# Patient Record
Sex: Female | Born: 1985
Health system: Southern US, Community
[De-identification: ages and names within clinical notes are randomized; demographics above are authoritative.]

## PROBLEM LIST (undated history)

## (undated) DIAGNOSIS — O24419 Gestational diabetes mellitus in pregnancy, unspecified control: Secondary | ICD-10-CM

## (undated) DIAGNOSIS — T7840XA Allergy, unspecified, initial encounter: Secondary | ICD-10-CM

## (undated) HISTORY — DX: Allergy, unspecified, initial encounter: T78.40XA

## (undated) HISTORY — DX: Gestational diabetes mellitus in pregnancy, unspecified control: O24.419

---

## 2009-12-20 ENCOUNTER — Other Ambulatory Visit: Admission: RE | Admit: 2009-12-20 | Discharge: 2009-12-20 | Payer: Self-pay | Admitting: Obstetrics and Gynecology

## 2010-03-05 ENCOUNTER — Inpatient Hospital Stay (HOSPITAL_COMMUNITY): Admission: AD | Admit: 2010-03-05 | Payer: Self-pay | Admitting: Obstetrics and Gynecology

## 2012-03-06 NOTE — L&D Delivery Note (Signed)
Delivery Note At 6:32 PM a viable female was delivered via Vaginal, Spontaneous Delivery (Presentation: ; Occiput Anterior).  APGAR: 9, 9; weight .   Placenta status: Intact, Spontaneous.  Cord: 3 vessels with the following complications: None.  Cord pH: Na  Anesthesia: Epidural  Episiotomy: None Lacerations: 2nd degree;Perineal;Periurethral Suture Repair: 3.0 vicryl Est. Blood Loss (mL): 300  Mom to postpartum.  Baby to nursery-stable.  Jeanmarc Viernes J. 09/24/2012, 7:48 PM

## 2012-03-15 LAB — OB RESULTS CONSOLE HIV ANTIBODY (ROUTINE TESTING): HIV: NONREACTIVE

## 2012-03-15 LAB — OB RESULTS CONSOLE ABO/RH: RH Type: POSITIVE

## 2012-03-15 LAB — OB RESULTS CONSOLE RPR: RPR: NONREACTIVE

## 2012-03-15 LAB — OB RESULTS CONSOLE ANTIBODY SCREEN: Antibody Screen: NEGATIVE

## 2012-03-19 ENCOUNTER — Other Ambulatory Visit (HOSPITAL_COMMUNITY)
Admission: RE | Admit: 2012-03-19 | Discharge: 2012-03-19 | Disposition: A | Discharge status: Home / Self Care | Payer: BC Managed Care – PPO | Source: Ambulatory Visit | Attending: Obstetrics and Gynecology | Admitting: Obstetrics and Gynecology

## 2012-03-19 DIAGNOSIS — Z113 Encounter for screening for infections with a predominantly sexual mode of transmission: Secondary | ICD-10-CM | POA: Insufficient documentation

## 2012-03-19 DIAGNOSIS — Z01419 Encounter for gynecological examination (general) (routine) without abnormal findings: Secondary | ICD-10-CM | POA: Insufficient documentation

## 2012-08-07 ENCOUNTER — Encounter: Payer: BC Managed Care – PPO | Attending: Obstetrics and Gynecology | Admitting: *Deleted

## 2012-08-07 VITALS — Ht 63.0 in | Wt 186.2 lb

## 2012-08-07 DIAGNOSIS — O99810 Abnormal glucose complicating pregnancy: Secondary | ICD-10-CM

## 2012-08-07 DIAGNOSIS — Z713 Dietary counseling and surveillance: Secondary | ICD-10-CM | POA: Insufficient documentation

## 2012-08-08 ENCOUNTER — Encounter: Payer: Self-pay | Admitting: *Deleted

## 2012-08-08 NOTE — Progress Notes (Signed)
  Patient was seen on 08/07/12 for Gestational Diabetes self-management class at the [facility]. The following learning objectives were met by the patient during this course:   States the definition of Gestational Diabetes  States why dietary management is important in controlling blood glucose  Describes the effects each nutrient has on blood glucose levels  Demonstrates ability to create a balanced meal plan  Demonstrates carbohydrate counting   States when to check blood glucose levels  Demonstrates proper blood glucose monitoring techniques  States the effect of stress and exercise on blood glucose levels  States the importance of limiting caffeine and abstaining from alcohol and smoking  Blood glucose monitor given: NA patient reported she has a meter at home already and is checking her glucose   Patient instructed to monitor glucose levels: FBS: 60 - <90 2 hour: <120  *Patient received handouts:  Nutrition Diabetes and Pregnancy  Carbohydrate Counting List  Patient will be seen for follow-up as needed.

## 2012-08-08 NOTE — Patient Instructions (Signed)
Goals:  Check glucose levels per MD as instructed  Follow Gestational Diabetes Diet as instructed  Call for follow-up as needed    

## 2012-08-29 LAB — OB RESULTS CONSOLE GBS: GBS: NEGATIVE

## 2012-09-23 ENCOUNTER — Other Ambulatory Visit: Payer: Self-pay | Admitting: Obstetrics and Gynecology

## 2012-09-23 ENCOUNTER — Telehealth: Payer: Self-pay | Admitting: Obstetrics and Gynecology

## 2012-09-23 NOTE — Telephone Encounter (Signed)
TC from patient--patient of Dr. Richardson Dopp, 39 weeks, G1P0, spotting and mucusy d/c. Denies pain, reports +FM.  Gestational diabetes, diet controlled. Has appt this upcoming day at [facility]. Advised to CTO at present, call back with any increase in bleeding, decreased FM, increased contractions/pain. Keep scheduled appt at [facility]--can call that office in the am prn, or call back during night with any questions or concerns.

## 2012-09-24 ENCOUNTER — Encounter (HOSPITAL_COMMUNITY): Payer: Self-pay | Admitting: Anesthesiology

## 2012-09-24 ENCOUNTER — Inpatient Hospital Stay (HOSPITAL_COMMUNITY)
Admission: RE | Admit: 2012-09-24 | Discharge: 2012-09-26 | DRG: 372 | Disposition: A | Discharge status: Home / Self Care | Payer: BC Managed Care – PPO | Source: Ambulatory Visit | Attending: Obstetrics and Gynecology | Admitting: Obstetrics and Gynecology

## 2012-09-24 ENCOUNTER — Inpatient Hospital Stay (HOSPITAL_COMMUNITY): Payer: BC Managed Care – PPO | Admitting: Anesthesiology

## 2012-09-24 ENCOUNTER — Encounter (HOSPITAL_COMMUNITY): Payer: Self-pay

## 2012-09-24 DIAGNOSIS — O24419 Gestational diabetes mellitus in pregnancy, unspecified control: Secondary | ICD-10-CM

## 2012-09-24 DIAGNOSIS — O2432 Unspecified pre-existing diabetes mellitus in childbirth: Principal | ICD-10-CM | POA: Diagnosis present

## 2012-09-24 DIAGNOSIS — Z8632 Personal history of gestational diabetes: Secondary | ICD-10-CM | POA: Diagnosis present

## 2012-09-24 DIAGNOSIS — E119 Type 2 diabetes mellitus without complications: Secondary | ICD-10-CM | POA: Diagnosis present

## 2012-09-24 LAB — CBC
MCH: 29.5 pg (ref 26.0–34.0)
MCHC: 35.5 g/dL (ref 30.0–36.0)
Platelets: 170 10*3/uL (ref 150–400)
RBC: 4.48 MIL/uL (ref 3.87–5.11)

## 2012-09-24 LAB — GLUCOSE, CAPILLARY: Glucose-Capillary: 86 mg/dL (ref 70–99)

## 2012-09-24 LAB — RPR: RPR Ser Ql: NONREACTIVE

## 2012-09-24 MED ORDER — OXYCODONE-ACETAMINOPHEN 5-325 MG PO TABS: 1.0000 | ORAL_TABLET | ORAL | Status: DC | PRN

## 2012-09-24 MED ORDER — IBUPROFEN 600 MG PO TABS: 600.0000 mg | ORAL_TABLET | Freq: Four times a day (QID) | ORAL | Status: DC | PRN

## 2012-09-24 MED ORDER — LANOLIN HYDROUS EX OINT: TOPICAL_OINTMENT | CUTANEOUS | Status: DC | PRN

## 2012-09-24 MED ORDER — DIPHENHYDRAMINE HCL 50 MG/ML IJ SOLN: 12.5000 mg | INTRAMUSCULAR | Status: DC | PRN

## 2012-09-24 MED ORDER — SENNOSIDES-DOCUSATE SODIUM 8.6-50 MG PO TABS
2.0000 | ORAL_TABLET | Freq: Every day | ORAL | Status: DC
  Administered 2012-09-25: 2 via ORAL

## 2012-09-24 MED ORDER — LIDOCAINE HCL (PF) 1 % IJ SOLN
30.0000 mL | INTRAMUSCULAR | Status: DC | PRN
  Filled 2012-09-24: qty 30

## 2012-09-24 MED ORDER — FENTANYL 2.5 MCG/ML BUPIVACAINE 1/10 % EPIDURAL INFUSION (WH - ANES)
14.0000 mL/h | INTRAMUSCULAR | Status: DC | PRN
  Filled 2012-09-24: qty 125

## 2012-09-24 MED ORDER — TETANUS-DIPHTH-ACELL PERTUSSIS 5-2.5-18.5 LF-MCG/0.5 IM SUSP
0.5000 mL | Freq: Once | INTRAMUSCULAR | Status: AC
  Administered 2012-09-26: 0.5 mL via INTRAMUSCULAR

## 2012-09-24 MED ORDER — PHENYLEPHRINE 40 MCG/ML (10ML) SYRINGE FOR IV PUSH (FOR BLOOD PRESSURE SUPPORT)
80.0000 ug | PREFILLED_SYRINGE | INTRAVENOUS | Status: DC | PRN
  Filled 2012-09-24: qty 5
  Filled 2012-09-24: qty 2

## 2012-09-24 MED ORDER — PRENATAL MULTIVITAMIN CH
1.0000 | ORAL_TABLET | Freq: Every day | ORAL | Status: DC
  Administered 2012-09-25 – 2012-09-26 (×2): 1 via ORAL
  Filled 2012-09-24 (×2): qty 1

## 2012-09-24 MED ORDER — PHENYLEPHRINE 40 MCG/ML (10ML) SYRINGE FOR IV PUSH (FOR BLOOD PRESSURE SUPPORT)
80.0000 ug | PREFILLED_SYRINGE | INTRAVENOUS | Status: DC | PRN
  Filled 2012-09-24: qty 2

## 2012-09-24 MED ORDER — EPHEDRINE 5 MG/ML INJ
10.0000 mg | INTRAVENOUS | Status: DC | PRN
  Filled 2012-09-24: qty 2
  Filled 2012-09-24: qty 4

## 2012-09-24 MED ORDER — IBUPROFEN 600 MG PO TABS
600.0000 mg | ORAL_TABLET | Freq: Four times a day (QID) | ORAL | Status: DC
  Administered 2012-09-24 – 2012-09-26 (×7): 600 mg via ORAL
  Filled 2012-09-24 (×7): qty 1

## 2012-09-24 MED ORDER — ONDANSETRON HCL 4 MG/2ML IJ SOLN: 4.0000 mg | Freq: Four times a day (QID) | INTRAMUSCULAR | Status: DC | PRN

## 2012-09-24 MED ORDER — CITRIC ACID-SODIUM CITRATE 334-500 MG/5ML PO SOLN: 30.0000 mL | ORAL | Status: DC | PRN

## 2012-09-24 MED ORDER — SIMETHICONE 80 MG PO CHEW: 80.0000 mg | CHEWABLE_TABLET | ORAL | Status: DC | PRN

## 2012-09-24 MED ORDER — LACTATED RINGERS IV SOLN: 500.0000 mL | Freq: Once | INTRAVENOUS | Status: DC

## 2012-09-24 MED ORDER — OXYTOCIN 40 UNITS IN LACTATED RINGERS INFUSION - SIMPLE MED
1.0000 m[IU]/min | INTRAVENOUS | Status: DC
Start: 2012-09-24 — End: 2012-09-24
  Administered 2012-09-24: 2 m[IU]/min via INTRAVENOUS
  Filled 2012-09-24: qty 1000

## 2012-09-24 MED ORDER — WITCH HAZEL-GLYCERIN EX PADS: 1.0000 "application " | MEDICATED_PAD | CUTANEOUS | Status: DC | PRN

## 2012-09-24 MED ORDER — ONDANSETRON HCL 4 MG PO TABS: 4.0000 mg | ORAL_TABLET | ORAL | Status: DC | PRN

## 2012-09-24 MED ORDER — METHYLERGONOVINE MALEATE 0.2 MG PO TABS: 0.2000 mg | ORAL_TABLET | ORAL | Status: DC | PRN

## 2012-09-24 MED ORDER — LIDOCAINE HCL (PF) 1 % IJ SOLN
INTRAMUSCULAR | Status: DC | PRN
  Administered 2012-09-24 (×2): 4 mL

## 2012-09-24 MED ORDER — DIPHENHYDRAMINE HCL 25 MG PO CAPS: 25.0000 mg | ORAL_CAPSULE | Freq: Four times a day (QID) | ORAL | Status: DC | PRN

## 2012-09-24 MED ORDER — OXYTOCIN BOLUS FROM INFUSION: 500.0000 mL | INTRAVENOUS | Status: DC

## 2012-09-24 MED ORDER — FENTANYL 2.5 MCG/ML BUPIVACAINE 1/10 % EPIDURAL INFUSION (WH - ANES)
INTRAMUSCULAR | Status: DC | PRN
  Administered 2012-09-24: 14 mL/h via EPIDURAL

## 2012-09-24 MED ORDER — METHYLERGONOVINE MALEATE 0.2 MG/ML IJ SOLN: 0.2000 mg | INTRAMUSCULAR | Status: DC | PRN

## 2012-09-24 MED ORDER — ACETAMINOPHEN 325 MG PO TABS: 650.0000 mg | ORAL_TABLET | ORAL | Status: DC | PRN

## 2012-09-24 MED ORDER — ZOLPIDEM TARTRATE 5 MG PO TABS: 5.0000 mg | ORAL_TABLET | Freq: Every evening | ORAL | Status: DC | PRN

## 2012-09-24 MED ORDER — LACTATED RINGERS IV SOLN
INTRAVENOUS | Status: DC
  Administered 2012-09-24 (×2): 1000 mL via INTRAVENOUS
  Administered 2012-09-24: 18:00:00 via INTRAVENOUS

## 2012-09-24 MED ORDER — BENZOCAINE-MENTHOL 20-0.5 % EX AERO
1.0000 "application " | INHALATION_SPRAY | CUTANEOUS | Status: DC | PRN
  Administered 2012-09-25: 1 via TOPICAL
  Filled 2012-09-24: qty 56

## 2012-09-24 MED ORDER — DIBUCAINE 1 % RE OINT: 1.0000 "application " | TOPICAL_OINTMENT | RECTAL | Status: DC | PRN

## 2012-09-24 MED ORDER — TERBUTALINE SULFATE 1 MG/ML IJ SOLN: 0.2500 mg | Freq: Once | INTRAMUSCULAR | Status: DC | PRN

## 2012-09-24 MED ORDER — LACTATED RINGERS IV SOLN: 500.0000 mL | INTRAVENOUS | Status: DC | PRN

## 2012-09-24 MED ORDER — BUTORPHANOL TARTRATE 1 MG/ML IJ SOLN: 1.0000 mg | INTRAMUSCULAR | Status: DC | PRN

## 2012-09-24 MED ORDER — OXYTOCIN 40 UNITS IN LACTATED RINGERS INFUSION - SIMPLE MED: 62.5000 mL/h | INTRAVENOUS | Status: DC

## 2012-09-24 MED ORDER — EPHEDRINE 5 MG/ML INJ
10.0000 mg | INTRAVENOUS | Status: DC | PRN
  Filled 2012-09-24: qty 2

## 2012-09-24 MED ORDER — ONDANSETRON HCL 4 MG/2ML IJ SOLN: 4.0000 mg | INTRAMUSCULAR | Status: DC | PRN

## 2012-09-24 NOTE — Anesthesia Preprocedure Evaluation (Signed)
Anesthesia Evaluation  Patient identified by MRN, date of birth, ID band Patient awake    Reviewed: Allergy & Precautions, H&P , NPO status , Patient's Chart, lab work & pertinent test results  Airway Mallampati: III TM Distance: >3 FB Neck ROM: Full    Dental no notable dental hx. (+) Teeth Intact   Pulmonary neg pulmonary ROS,  breath sounds clear to auscultation  Pulmonary exam normal       Cardiovascular negative cardio ROS  Rhythm:Regular Rate:Normal     Neuro/Psych negative neurological ROS  negative psych ROS   GI/Hepatic negative GI ROS, Neg liver ROS,   Endo/Other  diabetes, Well Controlled, Gestational  Renal/GU negative Renal ROS  negative genitourinary   Musculoskeletal negative musculoskeletal ROS (+)   Abdominal   Peds  Hematology negative hematology ROS (+)   Anesthesia Other Findings   Reproductive/Obstetrics (+) Pregnancy                           Anesthesia Physical Anesthesia Plan  ASA: II  Anesthesia Plan: Epidural   Post-op Pain Management:    Induction:   Airway Management Planned: Natural Airway  Additional Equipment:   Intra-op Plan:   Post-operative Plan:   Informed Consent: I have reviewed the patients History and Physical, chart, labs and discussed the procedure including the risks, benefits and alternatives for the proposed anesthesia with the patient or authorized representative who has indicated his/her understanding and acceptance.   Dental advisory given  Plan Discussed with: Anesthesiologist  Anesthesia Plan Comments:         Anesthesia Quick Evaluation

## 2012-09-24 NOTE — H&P (Signed)
Denise Drake is a 27 y.o. female presenting for  39wks and 2 days with A1 diabetes for induction.     Maternal Medical History:  Fetal activity: Perceived fetal activity is normal.   Last perceived fetal movement was within the past hour.      OB History   Grav Para Term Preterm Abortions TAB SAB Ect Mult Living   1              Past Medical History  Diagnosis Date  . Diabetes mellitus without complication    History reviewed. No pertinent past surgical history. Family History: family history includes Diabetes in her paternal grandfather. Social History:  reports that she has never smoked. She does not have any smokeless tobacco history on file. She reports that she does not drink alcohol or use illicit drugs.   Prenatal Transfer Tool  Maternal Diabetes: Yes:  Diabetes Type:  Diet controlled Genetic Screening: Normal Maternal Ultrasounds/Referrals: Normal Fetal Ultrasounds or other Referrals:  None Maternal Substance Abuse:  No Significant Maternal Medications:  None Significant Maternal Lab Results:  Lab values include: Group B Strep negative Other Comments:  None  Review of Systems  All other systems reviewed and are negative.    Dilation: 3.5 Effacement (%): 70 Station: 0 Exam by:: Savion Washam Blood pressure 95/59, pulse 95, temperature 98.2 F (36.8 C), temperature source Oral, resp. rate 18, height 5\' 3"  (1.6 m), weight 87.091 kg (192 lb). Maternal Exam:  Abdomen: Patient reports no abdominal tenderness. Fetal presentation: vertex  Introitus: Normal vulva. Normal vagina.    Fetal Exam Fetal Monitor Review: Mode: fetoscope.   Baseline rate: 130.  Variability: moderate (6-25 bpm).   Pattern: accelerations present and no decelerations.    Fetal State Assessment: Category I - tracings are normal.     Physical Exam  Constitutional: She is oriented to person, place, and time. She appears well-developed and well-nourished.  HENT:  Head: Normocephalic.   Cardiovascular: Normal rate and regular rhythm.   Respiratory: Effort normal.  Genitourinary: Vagina normal.  Musculoskeletal: Normal range of motion.  Neurological: She is alert and oriented to person, place, and time.  Skin: Skin is warm and dry.  Psychiatric: She has a normal mood and affect.    Prenatal labs: ABO, Rh: A/Positive/-- (01/10 0000) Antibody: Negative (01/10 0000) Rubella:   Immune  RPR: Nonreactive (05/09 0000)  HBsAg: Negative (01/10 0000)  HIV: Non-reactive, Non-reactive (01/10 0000)  GBS: Negative (06/26 0000)   Assessment/Plan: 39 wks and 2 days with A1 diabetes for induction Arom clear fluid.  Pitocin for induction.  Epidural for pain Anticipate svd    Merilee Wible J. 09/24/2012, 1:40 PM

## 2012-09-24 NOTE — Anesthesia Procedure Notes (Signed)
Epidural Patient location during procedure: OB Start time: 09/24/2012 1:52 PM  Staffing Anesthesiologist: Raybon Conard A. Performed by: anesthesiologist   Preanesthetic Checklist Completed: patient identified, site marked, surgical consent, pre-op evaluation, timeout performed, IV checked, risks and benefits discussed and monitors and equipment checked  Epidural Patient position: sitting Prep: site prepped and draped and DuraPrep Patient monitoring: continuous pulse ox and blood pressure Approach: midline Injection technique: LOR air  Needle:  Needle type: Tuohy  Needle gauge: 17 G Needle length: 9 cm and 9 Needle insertion depth: 5 cm cm Catheter type: closed end flexible Catheter size: 19 Gauge Catheter at skin depth: 10 cm Test dose: negative and Other  Assessment Events: blood not aspirated, injection not painful, no injection resistance, negative IV test and no paresthesia  Additional Notes Patient identified. Risks and benefits discussed including failed block, incomplete  Pain control, post dural puncture headache, nerve damage, paralysis, blood pressure Changes, nausea, vomiting, reactions to medications-both toxic and allergic and post Partum back pain. All questions were answered. Patient expressed understanding and wished to proceed. Sterile technique was used throughout procedure. Epidural site was Dressed with sterile barrier dressing. No paresthesias, signs of intravascular injection Or signs of intrathecal spread were encountered.  Patient was more comfortable after the epidural was dosed. Please see RN's note for documentation of vital signs and FHR which are stable.

## 2012-09-25 LAB — CBC
HCT: 34.9 % — ABNORMAL LOW (ref 36.0–46.0)
MCH: 29.7 pg (ref 26.0–34.0)
MCV: 83.7 fL (ref 78.0–100.0)
RDW: 13.6 % (ref 11.5–15.5)
WBC: 13 10*3/uL — ABNORMAL HIGH (ref 4.0–10.5)

## 2012-09-25 NOTE — Anesthesia Postprocedure Evaluation (Signed)
Anesthesia Post Note  Patient: Denise Drake  Procedure(s) Performed: * No procedures listed *  Anesthesia type: Epidural  Patient location: Mother/Baby  Post pain: Pain level controlled  Post assessment: Post-op Vital signs reviewed  Last Vitals:  Filed Vitals:   09/25/12 0630  BP: 131/85  Pulse: 114  Temp: 36.7 C  Resp: 18    Post vital signs: Reviewed  Level of consciousness:alert  Complications: No apparent anesthesia complications 

## 2012-09-25 NOTE — Progress Notes (Signed)
Post Partum Day 1 svd  Subjective: no complaints, up ad lib and tolerating PO  foley removed less than an hour ago no void yet.. Pt has not been seen by lactation consultant.  Objective: Blood pressure 103/66, pulse 96, temperature 98.3 F (36.8 C), temperature source Oral, resp. rate 18, height 5\' 3"  (1.6 m), weight 87.091 kg (192 lb), SpO2 98.00%, unknown if currently breastfeeding.  Physical Exam:  General: alert and cooperative Lochia: appropriate Uterine Fundus: firm Incision: NA DVT Evaluation: No evidence of DVT seen on physical exam.   Recent Labs  09/24/12 0700 09/25/12 0625  HGB 13.2 12.4  HCT 37.2 34.9*    Assessment/Plan: Plan for discharge tomorrow and Lactation consult  recommend ice pack to perineum to help with swelling now that foley is removed.    LOS: 1 day   Denise Drake J. 09/25/2012, 1:15 PM

## 2012-09-25 NOTE — Anesthesia Postprocedure Evaluation (Signed)
Anesthesia Post Note  Patient: Denise Drake  Procedure(s) Performed: * No procedures listed *  Anesthesia type: Epidural  Patient location: Mother/Baby  Post pain: Pain level controlled  Post assessment: Post-op Vital signs reviewed  Last Vitals:  Filed Vitals:   09/25/12 0630  BP: 131/85  Pulse: 114  Temp: 36.7 C  Resp: 18    Post vital signs: Reviewed  Level of consciousness:alert  Complications: No apparent anesthesia complications

## 2012-09-26 DIAGNOSIS — Z8632 Personal history of gestational diabetes: Secondary | ICD-10-CM | POA: Diagnosis present

## 2012-09-26 MED ORDER — IBUPROFEN 600 MG PO TABS: 600.0000 mg | ORAL_TABLET | Freq: Four times a day (QID) | ORAL | Status: DC | PRN

## 2012-09-26 NOTE — Discharge Summary (Signed)
Obstetric Discharge Summary Reason for Admission: induction of labor Prenatal Procedures: none Intrapartum Procedures: spontaneous vaginal delivery Postpartum Procedures: none Complications-Operative and Postpartum: 2nd  degree perineal laceration Hemoglobin  Date Value Range Status  09/25/2012 12.4  12.0 - 15.0 g/dL Final     HCT  Date Value Range Status  09/25/2012 34.9* 36.0 - 46.0 % Final    Physical Exam:  General: alert and cooperative Lochia: appropriate Uterine Fundus: firm Incision: NA DVT Evaluation: No evidence of DVT seen on physical exam.  Discharge Diagnoses: Term Pregnancy-delivered  Discharge Information: Date: 09/26/2012 Activity: pelvic rest Diet: routine Medications: PNV and Ibuprofen Condition: stable Instructions: refer to practice specific booklet Discharge to: home Follow-up Information   Follow up with Jessee Avers., MD. Schedule an appointment as soon as possible for a visit in 6 weeks.   Contact information:   [address] [place] [address] [phone]       Newborn Data: Live born female  Birth Weight: 6 lb 15.6 oz (3164 g) APGAR: 9, 9  Home with mother.  Lorrie Strauch J. 09/26/2012, 9:55 AM

## 2012-09-29 ENCOUNTER — Inpatient Hospital Stay (HOSPITAL_COMMUNITY): Admission: AD | Admit: 2012-09-29 | Payer: Self-pay | Source: Ambulatory Visit | Admitting: Obstetrics and Gynecology

## 2012-10-01 ENCOUNTER — Ambulatory Visit (HOSPITAL_COMMUNITY)
Admission: RE | Admit: 2012-10-01 | Discharge: 2012-10-01 | Disposition: A | Discharge status: Home / Self Care | Payer: BC Managed Care – PPO | Source: Ambulatory Visit | Attending: Obstetrics and Gynecology | Admitting: Obstetrics and Gynecology

## 2012-10-01 NOTE — Lactation Note (Signed)
Adult Lactation Consultation Outpatient Visit Note  Patient Name: Ilene Pinera Date of Birth: 1985-08-07 Gestational Age at Delivery: [redacted]w[redacted]d Type of Delivery: Vaginal Delivery 7/22 TERM   Birth weight - 7-3 oz  last weight - 6-5 oz with Smart start  Today's weight- 6-9.6 oz   Reason for [facility] appointment today - using a nipple shield and DL latch in the hospital Breastfeeding History: In the hospital had a challenging time with latch due to swollen areolas, D/C with a Lactation plan of care, and F/U for feeding assessment was today . ( Mom has followed the [facility] plan of care.)  Frequency of Breastfeeding: every 2 1/2 -3 hours for 20 -30 mins per mom  Length of Feeding:  Voids: 7-8 wets  Stools: 2 greenish yellow   Supplementing / Method: Supplementing after feedings 1-2 oz - EBM or formula  Pumping:  Type of Pump: DEBP Medela    Frequency:after every feeding for 10 -15 mins   Volume:  1 -1 1/2 oz   Comments:    Consultation Evaluation:  LC assessed both breast , full , but not engorged, nipples bilaterally appear pink , no breakdown ( per mom denies soreness). Resized #20 Nipple shield and #24 Nipple shield . LC had mom apply nipple shield ( which she did well on both breast ), Right nipple - # 24 proper fit , left #20 NS or #24 Nipple shield plained tomom once the swelling decreases ( areola , the #24 NS will be the one to use ) .                                               This same LC saw mom on the day of D/C from the hospital and the areola edema has changed slightly - Nipple shields and pumping are still indicated. ( mom aware )   Initial Feeding Assessment:             Do baby getting hungry and mom feeding 30 mins at the breast and supplementing 2 oz of formula an hour before the consult, LC unable to assess baby feeding ( LC only did a weight check )                                                            F/U apt. Made for next Tuesday 8/5 at 230 pm for feeding assessment  - ( Stressed to mom and dad if the baby gets hungry and hour before - only give her a snack and bring her in somewhat hungry so a full feeding assessment can be done with the nipple shields. In the mean time continue pumping after feedings for 10 1-15 mins to establish and protect milk supply, save milk and supplement back to baby if needed. Stressed rest and naps for mom and dad ( they both expressed feelings of being tired . )  Pre-feed Weight: 6-9.6 oz 2994 g  Post-feed Weight: Amount Transferred: Comments: see above  Total Breast milk Transferred this Visit:  Total Supplement Given:   Lactation Plan of Care -  See above under Lactation evaluation.    Follow-Up- F/U 8/5 Tuesday 230 pm  at [facility] for feeding assessment .                    - Per parents - F/U at [facility], [facility], [facility] in one month.       Kathrin Greathouse 10/01/2012, 4:54 PM

## 2012-10-08 ENCOUNTER — Ambulatory Visit (HOSPITAL_COMMUNITY): Payer: BC Managed Care – PPO

## 2012-10-15 ENCOUNTER — Ambulatory Visit (HOSPITAL_COMMUNITY): Payer: BC Managed Care – PPO | Attending: Obstetrics and Gynecology

## 2014-01-05 ENCOUNTER — Encounter (HOSPITAL_COMMUNITY): Payer: Self-pay

## 2017-01-15 ENCOUNTER — Ambulatory Visit (INDEPENDENT_AMBULATORY_CARE_PROVIDER_SITE_OTHER): Payer: BLUE CROSS/BLUE SHIELD | Admitting: Family Medicine

## 2017-01-15 ENCOUNTER — Encounter: Payer: Self-pay | Admitting: Family Medicine

## 2017-01-15 VITALS — BP 100/70 | HR 98 | Ht 63.0 in | Wt 153.0 lb

## 2017-01-15 DIAGNOSIS — Z0001 Encounter for general adult medical examination with abnormal findings: Secondary | ICD-10-CM | POA: Diagnosis not present

## 2017-01-15 DIAGNOSIS — Z8632 Personal history of gestational diabetes: Secondary | ICD-10-CM | POA: Diagnosis not present

## 2017-01-15 NOTE — Progress Notes (Signed)
Subjective:  Mikel CellaSharmista Guevarra is a 31 y.o. female who presents today for her annual comprehensive physical exam.    HPI:  Dayne Coward has no acute complaints today.   Lifestyle Diet: Balanced diet. A lot of carbs. Plenty of fruits and vegetables.  Exercise: Do pushups in the morning. Planning to start.   Depression screen PHQ 2/9 01/15/2017  Decreased Interest 0  Down, Depressed, Hopeless 0  PHQ - 2 Score 0   Pertinent Gynecological History: No LMP recorded. Sexually active: No Menses: flow is moderate Last pap: normal Date: 2013  OB History    Gravida Para Term Preterm AB Living   1 1 1     1    SAB TAB Ectopic Multiple Live Births           1     Health Maintenance Due  Topic Date Due  . PAP SMEAR  11/21/2005    ROS: Per HPI, otherwise a 14 point review of systems was performed and was negative  PMH:  The following were reviewed and entered/updated in epic: Past Medical History:  Diagnosis Date  . Diabetes mellitus without complication [facility](HCC)    Patient Active Problem List   Diagnosis Date Noted  . Gestational diabetes 09/26/2012   History reviewed. No pertinent surgical history.  Family History  Problem Relation Age of Onset  . Diabetes Paternal Grandfather    Medications- reviewed and updated No current outpatient medications on file.   No current facility-administered medications for this visit.     Allergies-reviewed and updated Allergies  Allergen Reactions  . Eucalyptus Oil Rash    Social History   Socioeconomic History  . Marital status: Single    Spouse name: None  . Number of children: 1  . Years of education: None  . Highest education level: None  Social Needs  . Financial resource strain: None  . Food insecurity - worry: None  . Food insecurity - inability: None  . Transportation needs - medical: None  . Transportation needs - non-medical: None  Occupational History  . Occupation: Consulting civil engineertudent   Tobacco Use  .  Smoking status: Never Smoker  . Smokeless tobacco: Never Used  Substance and Sexual Activity  . Alcohol use: No  . Drug use: No  . Sexual activity: Yes  Other Topics Concern  . None  Social History Narrative  . None   Objective:  Physical Exam: BP 100/70   Pulse 98   Ht 5\' 3"  (1.6 m)   Wt 153 lb (69.4 kg)   SpO2 99%   BMI 27.10 kg/m   Body mass index is 27.1 kg/m. Gen: NAD, resting comfortably HEENT: TMs normal bilaterally. OP clear. No thyromegaly noted.  CV: RRR with no murmurs appreciated Pulm: NWOB, CTAB with no crackles, wheezes, or rhonchi GI: Normal bowel sounds present. Soft, Nontender, Nondistended. MSK: no edema, cyanosis, or clubbing noted Skin: warm, dry Neuro: CN2-12 grossly intact. Strength 5/5 in upper and lower extremities. Reflexes symmetric and intact bilaterally.  Psych: Normal affect and thought content  Assessment/Plan:  Preventative Healthcare: Check basic labs, lipid panel, and A1c.  Flu shot deferred.  Patient will be going to her OB GYN for Pap smear next month.  Patient Counseling:  -Nutrition: Stressed importance of moderation in sodium/caffeine intake, saturated fat and cholesterol, caloric balance, sufficient intake of fresh fruits, vegetables, and fiber.  -Stressed the importance of regular exercise.   -Substance Abuse: Discussed cessation/primary prevention of tobacco, alcohol, or other drug use; driving or  other dangerous activities under the influence; availability of treatment for abuse.   -Injury prevention: Discussed safety belts, safety helmets, smoke detector, smoking near bedding or upholstery.   -Dental health: Discussed importance of regular tooth brushing, flossing, and dental visits.  -Health maintenance and immunizations reviewed. Please refer to Health maintenance section.  Return to care in 1 year for next preventative visit.   Katina Degreealeb M. Jimmey RalphParker, MD 01/15/2017 4:17 PM

## 2017-01-15 NOTE — Patient Instructions (Signed)
Preventive Care 18-39 Years, Female Preventive care refers to lifestyle choices and visits with your health care provider that can promote health and wellness. What does preventive care include?  A yearly physical exam. This is also called an annual well check.  Dental exams once or twice a year.  Routine eye exams. Ask your health care provider how often you should have your eyes checked.  Personal lifestyle choices, including: ? Daily care of your teeth and gums. ? Regular physical activity. ? Eating a healthy diet. ? Avoiding tobacco and drug use. ? Limiting alcohol use. ? Practicing safe sex. ? Taking vitamin and mineral supplements as recommended by your health care provider. What happens during an annual well check? The services and screenings done by your health care provider during your annual well check will depend on your age, overall health, lifestyle risk factors, and family history of disease. Counseling Your health care provider may ask you questions about your:  Alcohol use.  Tobacco use.  Drug use.  Emotional well-being.  Home and relationship well-being.  Sexual activity.  Eating habits.  Work and work Statistician.  Method of birth control.  Menstrual cycle.  Pregnancy history.  Screening You may have the following tests or measurements:  Height, weight, and BMI.  Diabetes screening. This is done by checking your blood sugar (glucose) after you have not eaten for a while (fasting).  Blood pressure.  Lipid and cholesterol levels. These may be checked every 5 years starting at age 66.  Skin check.  Hepatitis C blood test.  Hepatitis B blood test.  Sexually transmitted disease (STD) testing.  BRCA-related cancer screening. This may be done if you have a family history of breast, ovarian, tubal, or peritoneal cancers.  Pelvic exam and Pap test. This may be done every 3 years starting at age 40. Starting at age 59, this may be done every 5  years if you have a Pap test in combination with an HPV test.  Discuss your test results, treatment options, and if necessary, the need for more tests with your health care provider. Vaccines Your health care provider may recommend certain vaccines, such as:  Influenza vaccine. This is recommended every year.  Tetanus, diphtheria, and acellular pertussis (Tdap, Td) vaccine. You may need a Td booster every 10 years.  Varicella vaccine. You may need this if you have not been vaccinated.  HPV vaccine. If you are 69 or younger, you may need three doses over 6 months.  Measles, mumps, and rubella (MMR) vaccine. You may need at least one dose of MMR. You may also need a second dose.  Pneumococcal 13-valent conjugate (PCV13) vaccine. You may need this if you have certain conditions and were not previously vaccinated.  Pneumococcal polysaccharide (PPSV23) vaccine. You may need one or two doses if you smoke cigarettes or if you have certain conditions.  Meningococcal vaccine. One dose is recommended if you are age 27-21 years and a first-year college student living in a residence hall, or if you have one of several medical conditions. You may also need additional booster doses.  Hepatitis A vaccine. You may need this if you have certain conditions or if you travel or work in places where you may be exposed to hepatitis A.  Hepatitis B vaccine. You may need this if you have certain conditions or if you travel or work in places where you may be exposed to hepatitis B.  Haemophilus influenzae type b (Hib) vaccine. You may need this if  you have certain risk factors.  Talk to your health care provider about which screenings and vaccines you need and how often you need them. This information is not intended to replace advice given to you by your health care provider. Make sure you discuss any questions you have with your health care provider. Document Released: 04/18/2001 Document Revised: 11/10/2015  Document Reviewed: 12/22/2014 Elsevier Interactive Patient Education  2017 [employer].

## 2017-01-16 LAB — HEMOGLOBIN A1C: HEMOGLOBIN A1C: 4.8 % (ref 4.6–6.5)

## 2017-01-16 LAB — LIPID PANEL
CHOL/HDL RATIO: 3
Cholesterol: 135 mg/dL (ref 0–200)
HDL: 53.5 mg/dL (ref >39.00)
LDL Cholesterol: 62 mg/dL (ref 0–99)
NONHDL: 81.37
Triglycerides: 99 mg/dL (ref 0.0–149.0)
VLDL: 19.8 mg/dL (ref 0.0–40.0)

## 2017-01-16 LAB — COMPREHENSIVE METABOLIC PANEL
ALBUMIN: 4.1 g/dL (ref 3.5–5.2)
ALK PHOS: 56 U/L (ref 39–117)
ALT: 12 U/L (ref 0–35)
AST: 17 U/L (ref 0–37)
BILIRUBIN TOTAL: 0.3 mg/dL (ref 0.2–1.2)
BUN: 9 mg/dL (ref 6–23)
CALCIUM: 9.1 mg/dL (ref 8.4–10.5)
CHLORIDE: 103 meq/L (ref 96–112)
CO2: 28 mEq/L (ref 19–32)
CREATININE: 0.56 mg/dL (ref 0.40–1.20)
GFR: 133.21 mL/min (ref >60.00)
Glucose, Bld: 89 mg/dL (ref 70–99)
Potassium: 4.3 mEq/L (ref 3.5–5.1)
Sodium: 137 mEq/L (ref 135–145)
TOTAL PROTEIN: 7.2 g/dL (ref 6.0–8.3)

## 2017-01-16 LAB — CBC
HEMATOCRIT: 38 % (ref 36.0–46.0)
HEMOGLOBIN: 12.4 g/dL (ref 12.0–15.0)
MCHC: 32.8 g/dL (ref 30.0–36.0)
MCV: 83.6 fl (ref 78.0–100.0)
Platelets: 189 10*3/uL (ref 150.0–400.0)
RBC: 4.54 Mil/uL (ref 3.87–5.11)
RDW: 13.5 % (ref 11.5–15.5)
WBC: 7.2 10*3/uL (ref 4.0–10.5)

## 2017-01-18 NOTE — Progress Notes (Signed)
Labs all normal. Please inform patient.   Katina Degreealeb M. Jimmey RalphParker, MD 01/18/2017 11:07 AM

## 2017-02-13 ENCOUNTER — Other Ambulatory Visit: Payer: Self-pay | Admitting: Nurse Practitioner

## 2017-02-13 ENCOUNTER — Other Ambulatory Visit (HOSPITAL_COMMUNITY)
Admission: RE | Admit: 2017-02-13 | Discharge: 2017-02-13 | Disposition: A | Discharge status: Home / Self Care | Payer: BLUE CROSS/BLUE SHIELD | Source: Ambulatory Visit | Attending: Nurse Practitioner | Admitting: Nurse Practitioner

## 2017-02-13 DIAGNOSIS — Z124 Encounter for screening for malignant neoplasm of cervix: Secondary | ICD-10-CM | POA: Diagnosis not present

## 2017-02-15 LAB — CYTOLOGY - PAP
DIAGNOSIS: NEGATIVE
HPV (WINDOPATH): NOT DETECTED

## 2017-07-02 ENCOUNTER — Ambulatory Visit (INDEPENDENT_AMBULATORY_CARE_PROVIDER_SITE_OTHER): Payer: BLUE CROSS/BLUE SHIELD | Admitting: Family Medicine

## 2017-07-02 ENCOUNTER — Encounter: Payer: Self-pay | Admitting: Family Medicine

## 2017-07-02 VITALS — BP 98/58 | HR 113 | Temp 98.6°F | Ht 62.0 in | Wt 156.0 lb

## 2017-07-02 DIAGNOSIS — M79604 Pain in right leg: Secondary | ICD-10-CM

## 2017-07-02 DIAGNOSIS — M79605 Pain in left leg: Secondary | ICD-10-CM

## 2017-07-02 DIAGNOSIS — J020 Streptococcal pharyngitis: Secondary | ICD-10-CM | POA: Diagnosis not present

## 2017-07-02 DIAGNOSIS — J029 Acute pharyngitis, unspecified: Secondary | ICD-10-CM

## 2017-07-02 LAB — POCT RAPID STREP A (OFFICE): Rapid Strep A Screen: POSITIVE — AB

## 2017-07-02 MED ORDER — AMOXICILLIN 875 MG PO TABS: 875.0000 mg | ORAL_TABLET | Freq: Two times a day (BID) | ORAL | 0 refills | Status: DC

## 2017-07-02 NOTE — Progress Notes (Signed)
    Subjective:  Denise Drake is a 32 y.o. female who presents today for same-day appointment with a chief complaint of sore throat.   HPI:  Sore Throat, Acute Issue   Started yesterday.  Worsened today.  Recently on vacation to the [employer] and was around other sick individuals.  Occasional fever.  No reported rash.  Pain is worse with swallowing.  No other obvious alleviating or aggravating factors.  Leg pain, acute issue Started within the last week.  Located in her calves bilaterally.  Thinks it is due to exertion related to walking on the beach.  Worse with going up steps.   ROS: Per HPI  PMH: She reports that she has never smoked. She has never used smokeless tobacco. She reports that she does not drink alcohol or use drugs.  Objective:  Physical Exam: BP (!) 98/58   Pulse (!) 113   Temp 98.6 F (37 C) (Oral)   Ht  (1.575 m)   Wt 156 lb (70.8 kg)   SpO2 99%   Breastfeeding? No   BMI 28.53 kg/m   Gen: NAD, resting comfortably HEENT: TMs clear.  Bibasilar tonsillar hypertrophy. CV: RRR with no murmurs appreciated Pulm: NWOB, CTAB with no crackles, wheezes, or rhonchi MSK: Mild tenderness to palpation of calves bilaterally.  Homans sign negative.  No edema.  Results for orders placed or performed in visit on 07/02/17 (from the past 24 hour(s))  POCT rapid strep A     Status: Abnormal   Collection Time: 07/02/17  4:53 PM  Result Value Ref Range   Rapid Strep A Screen Positive (A) Negative   Assessment/Plan:  Strep pharyngitis Rapid strep positive.  Start amoxicillin 875 mg twice daily for 1 week.  Offered systemic steroids, however patient deferred.  Encouraged good oral hydration.  Also recommend Tylenol and/or Motrin as needed.  Bilateral leg pain Likely related to mild muscular strain from over exertion while walking on the beach.  No red flag signs or symptoms.  Well score 0-doubt DVT.  Encouraged heating pads, and Tylenol and/or Motrin as needed.   Return precautions reviewed.  Katina Degree. Jimmey Ralph, MD 07/02/2017 4:55 PM

## 2017-07-02 NOTE — Patient Instructions (Signed)
You have strep throat.  Please start amoxicillin today.  Please take 1 pill twice daily for the next week.  Please stay well-hydrated.  You can also try taking Tylenol and/or ibuprofen as needed for pain and fever.  If your symptoms worsen, or do not improve over the next several days, please let me know.  Take care, Dr. Jimmey Ralph

## 2017-12-19 ENCOUNTER — Other Ambulatory Visit: Payer: Self-pay | Admitting: Obstetrics and Gynecology

## 2017-12-19 DIAGNOSIS — N6310 Unspecified lump in the right breast, unspecified quadrant: Secondary | ICD-10-CM

## 2017-12-24 ENCOUNTER — Other Ambulatory Visit: Payer: Self-pay | Admitting: Obstetrics and Gynecology

## 2017-12-24 ENCOUNTER — Ambulatory Visit
Admission: RE | Admit: 2017-12-24 | Discharge: 2017-12-24 | Disposition: A | Discharge status: Home / Self Care | Payer: BLUE CROSS/BLUE SHIELD | Source: Ambulatory Visit | Attending: Obstetrics and Gynecology | Admitting: Obstetrics and Gynecology

## 2017-12-24 DIAGNOSIS — N6310 Unspecified lump in the right breast, unspecified quadrant: Secondary | ICD-10-CM

## 2017-12-26 ENCOUNTER — Ambulatory Visit
Admission: RE | Admit: 2017-12-26 | Discharge: 2017-12-26 | Disposition: A | Discharge status: Home / Self Care | Payer: BLUE CROSS/BLUE SHIELD | Source: Ambulatory Visit | Attending: Obstetrics and Gynecology | Admitting: Obstetrics and Gynecology

## 2017-12-26 DIAGNOSIS — N6310 Unspecified lump in the right breast, unspecified quadrant: Secondary | ICD-10-CM

## 2018-01-15 ENCOUNTER — Encounter: Payer: BLUE CROSS/BLUE SHIELD | Admitting: Family Medicine

## 2018-01-15 DIAGNOSIS — Z0289 Encounter for other administrative examinations: Secondary | ICD-10-CM

## 2018-01-24 ENCOUNTER — Encounter: Payer: Self-pay | Admitting: Family Medicine

## 2018-02-12 ENCOUNTER — Encounter: Payer: Self-pay | Admitting: Family Medicine

## 2018-02-12 ENCOUNTER — Ambulatory Visit (INDEPENDENT_AMBULATORY_CARE_PROVIDER_SITE_OTHER): Payer: BLUE CROSS/BLUE SHIELD | Admitting: Family Medicine

## 2018-02-12 VITALS — BP 108/68 | HR 73 | Temp 98.6°F | Ht 62.0 in | Wt 156.2 lb

## 2018-02-12 DIAGNOSIS — Z8632 Personal history of gestational diabetes: Secondary | ICD-10-CM | POA: Diagnosis not present

## 2018-02-12 DIAGNOSIS — Z0001 Encounter for general adult medical examination with abnormal findings: Secondary | ICD-10-CM

## 2018-02-12 DIAGNOSIS — Z87898 Personal history of other specified conditions: Secondary | ICD-10-CM | POA: Insufficient documentation

## 2018-02-12 DIAGNOSIS — Z6828 Body mass index (BMI) 28.0-28.9, adult: Secondary | ICD-10-CM | POA: Diagnosis not present

## 2018-02-12 NOTE — Progress Notes (Signed)
Subjective:  Mikel CellaSharmista Phang is a 32 y.o. female who presents today for her annual comprehensive physical exam.    HPI:  She has no acute complaints today.   She was recently diagnosed with a breast lump and underwent biopsy which was benign.   Lifestyle Diet: No specific diets. Tries to eat a healthy, balanced diet.  Exercise: No regular exercise.   Depression screen PHQ 2/9 02/12/2018  Decreased Interest 0  Down, Depressed, Hopeless 0  PHQ - 2 Score 0   There are no preventive care reminders to display for this patient.   ROS: Per HPI, otherwise a complete review of systems was negative.   PMH:  The following were reviewed and entered/updated in epic: Past Medical History:  Diagnosis Date  . Diabetes mellitus without complication [facility](HCC)    Patient Active Problem List   Diagnosis Date Noted  . History of breast lump 02/12/2018  . History of gestational diabetes 09/26/2012   History reviewed. No pertinent surgical history.  Family History  Problem Relation Age of Onset  . Diabetes Paternal Grandfather     Medications- reviewed and updated No current outpatient medications on file.   No current facility-administered medications for this visit.     Allergies-reviewed and updated Allergies  Allergen Reactions  . Eucalyptus Oil Rash    Social History   Socioeconomic History  . Marital status: Single    Spouse name: Not on file  . Number of children: 1  . Years of education: Not on file  . Highest education level: Not on file  Occupational History  . Occupation: Consulting civil engineertudent   Social Needs  . Financial resource strain: Not on file  . Food insecurity:    Worry: Not on file    Inability: Not on file  . Transportation needs:    Medical: Not on file    Non-medical: Not on file  Tobacco Use  . Smoking status: Never Smoker  . Smokeless tobacco: Never Used  Substance and Sexual Activity  . Alcohol use: No  . Drug use: No  . Sexual activity: Yes    Lifestyle  . Physical activity:    Days per week: Not on file    Minutes per session: Not on file  . Stress: Not on file  Relationships  . Social connections:    Talks on phone: Not on file    Gets together: Not on file    Attends religious service: Not on file    Active member of club or organization: Not on file    Attends meetings of clubs or organizations: Not on file    Relationship status: Not on file  Other Topics Concern  . Not on file  Social History Narrative  . Not on file   Objective:  Physical Exam: BP 108/68 (BP Location: Left Arm, Patient Position: Sitting, Cuff Size: Normal)   Pulse 73   Temp 98.6 F (37 C) (Oral)   Ht 5\' 2"  (1.575 m)   Wt 156 lb 4 oz (70.9 kg)   LMP 01/31/2018   SpO2 98%   BMI 28.58 kg/m   Body mass index is 28.58 kg/m. Wt Readings from Last 3 Encounters:  02/12/18 156 lb 4 oz (70.9 kg)  07/02/17 156 lb (70.8 kg)  01/15/17 153 lb (69.4 kg)   Gen: NAD, resting comfortably HEENT: TMs normal bilaterally. OP clear. No thyromegaly noted.  CV: RRR with no murmurs appreciated Pulm: NWOB, CTAB with no crackles, wheezes, or rhonchi GI: Normal bowel  sounds present. Soft, Nontender, Nondistended. MSK: no edema, cyanosis, or clubbing noted Skin: warm, dry Neuro: CN2-12 grossly intact. Strength 5/5 in upper and lower extremities. Reflexes symmetric and intact bilaterally.  Psych: Normal affect and thought content  Assessment/Plan:  BMI 28 / History of Gestational Diabetes Blood work last year within normal limits. Discussed diet and exercise.   Preventative Healthcare: UTD on vaccines and screenings.   Patient Counseling(The following topics were reviewed and/or handout was given):  -Nutrition: Stressed importance of moderation in sodium/caffeine intake, saturated fat and cholesterol, caloric balance, sufficient intake of fresh fruits, vegetables, and fiber.  -Stressed the importance of regular exercise.   -Substance Abuse: Discussed  cessation/primary prevention of tobacco, alcohol, or other drug use; driving or other dangerous activities under the influence; availability of treatment for abuse.   -Injury prevention: Discussed safety belts, safety helmets, smoke detector, smoking near bedding or upholstery.   -Sexuality: Discussed sexually transmitted diseases, partner selection, use of condoms, avoidance of unintended pregnancy and contraceptive alternatives.   -Dental health: Discussed importance of regular tooth brushing, flossing, and dental visits.  -Health maintenance and immunizations reviewed. Please refer to Health maintenance section.  Return to care in 1 year for next preventative visit.   Katina Degree. Jimmey Ralph, MD 02/12/2018 9:51 AM

## 2018-02-12 NOTE — Patient Instructions (Signed)
It was very nice to see you today!  Keep up the good work!  Please continue working on a healthy diet and regular exercise.  Come back in 1 year for your next physical, or sooner as needed.   Take care, Dr Jerline Pain   Preventive Care 33-39 Years, Female Preventive care refers to lifestyle choices and visits with your health care provider that can promote health and wellness. What does preventive care include?  A yearly physical exam. This is also called an annual well check.  Dental exams once or twice a year.  Routine eye exams. Ask your health care provider how often you should have your eyes checked.  Personal lifestyle choices, including: ? Daily care of your teeth and gums. ? Regular physical activity. ? Eating a healthy diet. ? Avoiding tobacco and drug use. ? Limiting alcohol use. ? Practicing safe sex. ? Taking vitamin and mineral supplements as recommended by your health care provider. What happens during an annual well check? The services and screenings done by your health care provider during your annual well check will depend on your age, overall health, lifestyle risk factors, and family history of disease. Counseling Your health care provider may ask you questions about your:  Alcohol use.  Tobacco use.  Drug use.  Emotional well-being.  Home and relationship well-being.  Sexual activity.  Eating habits.  Work and work Statistician.  Method of birth control.  Menstrual cycle.  Pregnancy history.  Screening You may have the following tests or measurements:  Height, weight, and BMI.  Diabetes screening. This is done by checking your blood sugar (glucose) after you have not eaten for a while (fasting).  Blood pressure.  Lipid and cholesterol levels. These may be checked every 5 years starting at age 33.  Skin check.  Hepatitis C blood test.  Hepatitis B blood test.  Sexually transmitted disease (STD) testing.  BRCA-related cancer  screening. This may be done if you have a family history of breast, ovarian, tubal, or peritoneal cancers.  Pelvic exam and Pap test. This may be done every 3 years starting at age 33. Starting at age 35, this may be done every 5 years if you have a Pap test in combination with an HPV test.  Discuss your test results, treatment options, and if necessary, the need for more tests with your health care provider. Vaccines Your health care provider may recommend certain vaccines, such as:  Influenza vaccine. This is recommended every year.  Tetanus, diphtheria, and acellular pertussis (Tdap, Td) vaccine. You may need a Td booster every 10 years.  Varicella vaccine. You may need this if you have not been vaccinated.  HPV vaccine. If you are 61 or younger, you may need three doses over 6 months.  Measles, mumps, and rubella (MMR) vaccine. You may need at least one dose of MMR. You may also need a second dose.  Pneumococcal 13-valent conjugate (PCV13) vaccine. You may need this if you have certain conditions and were not previously vaccinated.  Pneumococcal polysaccharide (PPSV23) vaccine. You may need one or two doses if you smoke cigarettes or if you have certain conditions.  Meningococcal vaccine. One dose is recommended if you are age 22-21 years and a first-year college student living in a residence hall, or if you have one of several medical conditions. You may also need additional booster doses.  Hepatitis A vaccine. You may need this if you have certain conditions or if you travel or work in places where you  may be exposed to hepatitis A.  Hepatitis B vaccine. You may need this if you have certain conditions or if you travel or work in places where you may be exposed to hepatitis B.  Haemophilus influenzae type b (Hib) vaccine. You may need this if you have certain risk factors.  Talk to your health care provider about which screenings and vaccines you need and how often you need  them. This information is not intended to replace advice given to you by your health care provider. Make sure you discuss any questions you have with your health care provider. Document Released: 04/18/2001 Document Revised: 11/10/2015 Document Reviewed: 12/22/2014 Elsevier Interactive Patient Education  [employer].

## 2020-04-09 IMAGING — MG DIGITAL DIAGNOSTIC BILATERAL MAMMOGRAM WITH TOMO AND CAD
6 of 10 series · 6 of 30 positions shown · non-contrast
Comparison: None.

CLINICAL DATA: Patient complains of a palpable mass in the right
breast.

EXAM:
DIGITAL DIAGNOSTIC BILATERAL MAMMOGRAM WITH CAD AND TOMO
ULTRASOUND RIGHT BREAST

[L CC synth-2D]
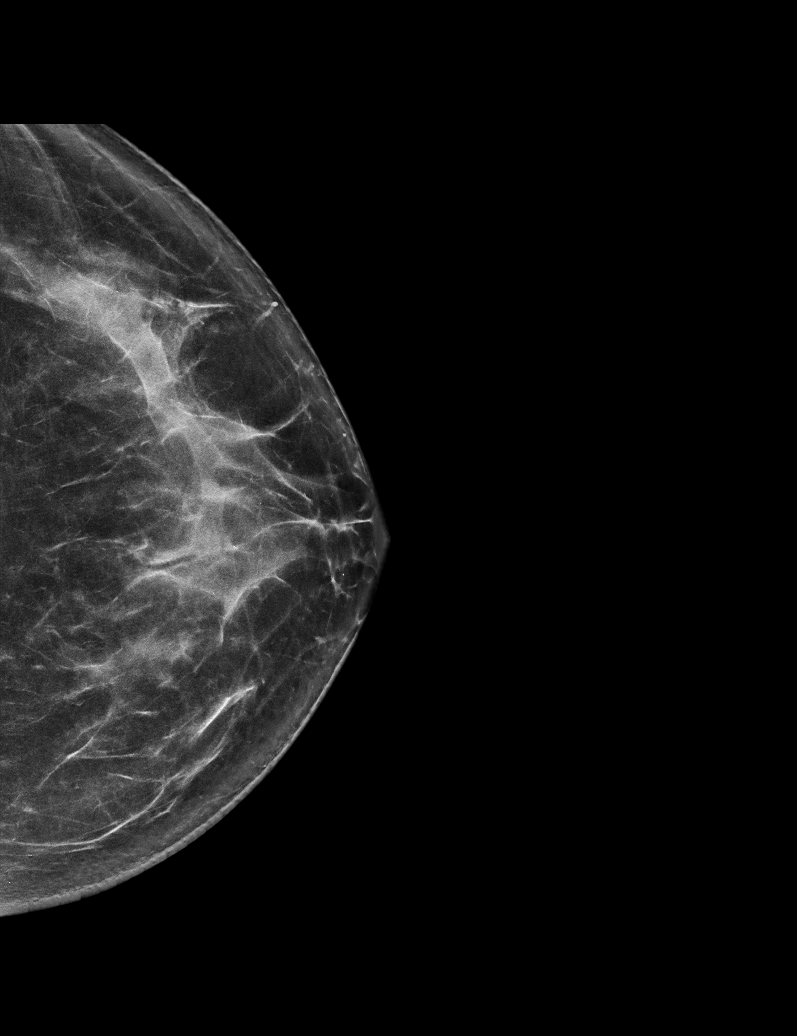

[R CC synth-2D (1 of 2)]
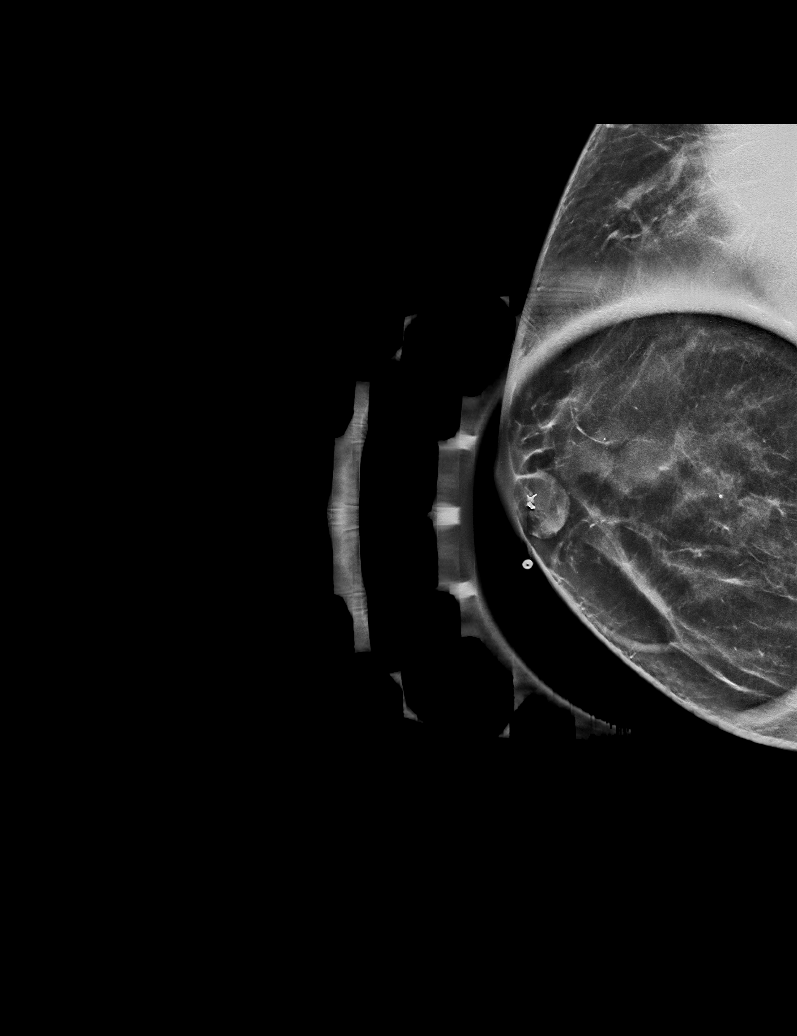

[R CC synth-2D (2 of 2)]
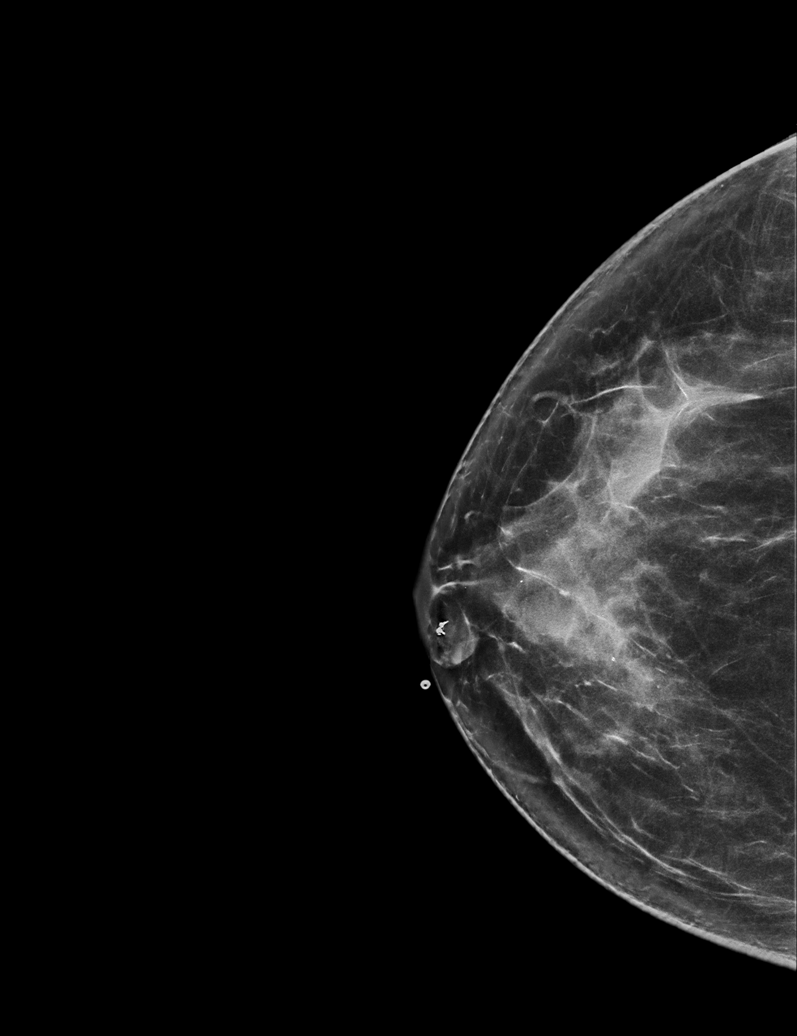

[L MLO synth-2D]
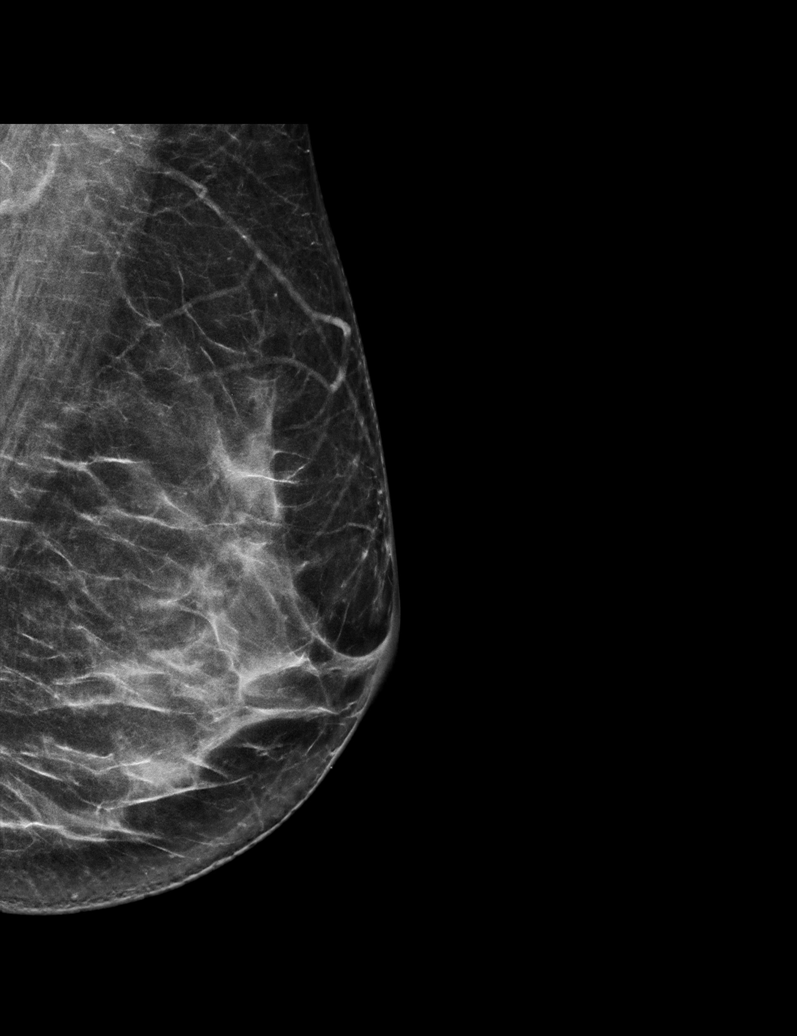

[R MLO synth-2D]
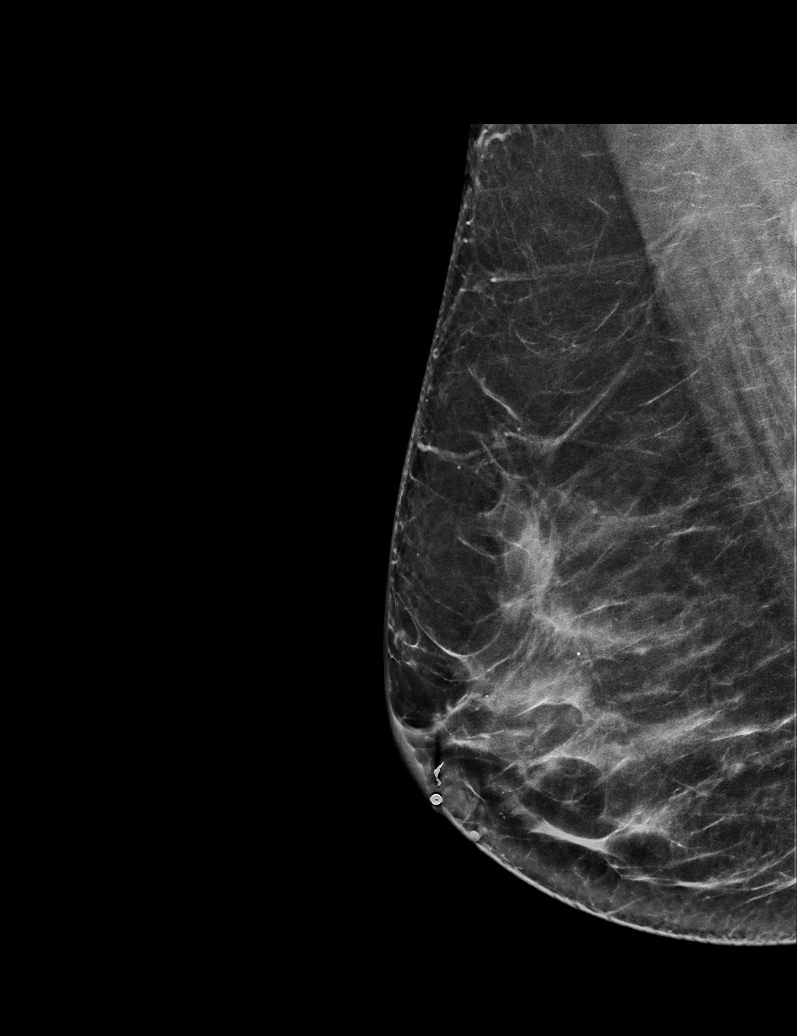

[L MLO tomo · tomo slice 37/72.0]
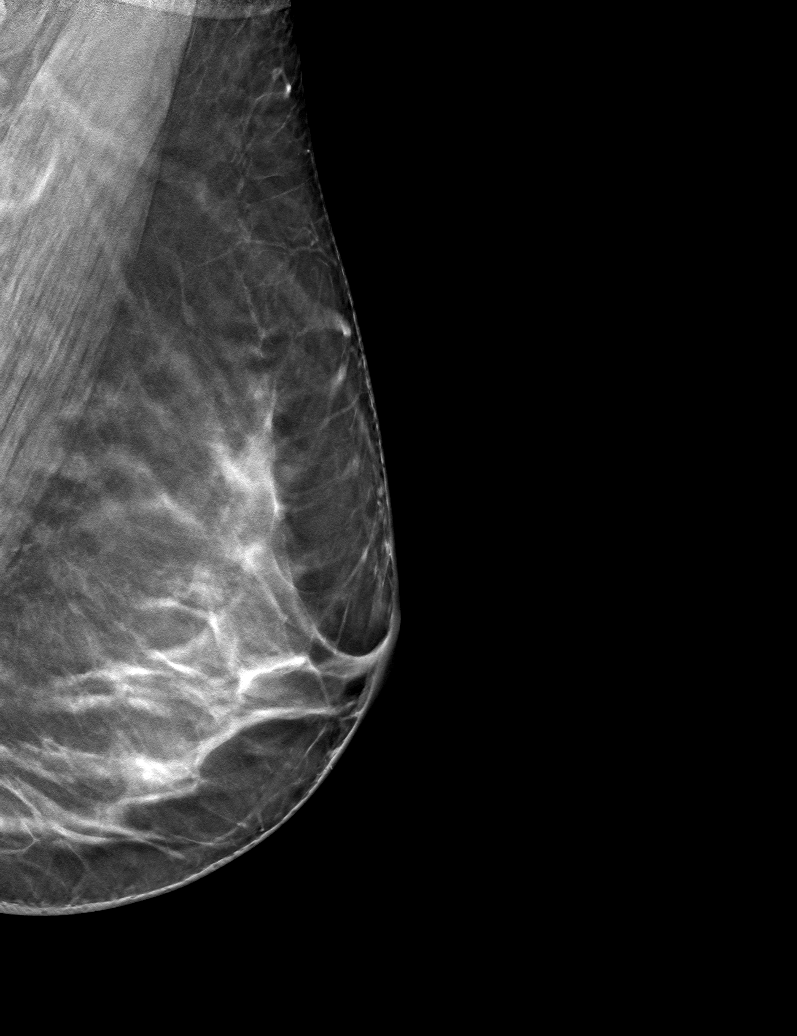

[6 of 30 positions shown; findings below may reference images not displayed]

ACR Breast Density Category c: The breast tissue is heterogeneously
dense, which may obscure small masses.
FINDINGS: In the 3 o'clock retroareolar region of the right breast is a
well-circumscribed 1.4 cm mass with coarse calcifications. No
additional mass is seen in the right breast. No suspicious mass or
malignant type microcalcifications identified in the left breast.

Mammographic images were processed with CAD.

On physical exam, I palpate a discrete mass in the 3 o'clock
retroareolar region of the right breast.

Targeted ultrasound is performed, showing there is a mixed echogenic
mass in the right breast at 3 o'clock in the retroareolar region
measuring 1.4 x 1.3 x 0.9 cm. There is a well-circumscribed
hypoechoic mass in the right breast at 12 o'clock in the
retroareolar region measuring 1.2 x 0.9 x 1.4 cm. Sonographic
evaluation the right axilla does not show any enlarged adenopathy.
IMPRESSION: Indeterminate masses in the 3 o'clock and 12 o'clock retroareolar
regions of the right breast.

RECOMMENDATION:
Ultrasound-guided core biopsies of the masses in the right breast
recommended.

I have discussed the findings and recommendations with the patient.
Results were also provided in writing at the conclusion of the
visit. If applicable, a reminder letter will be sent to the patient
regarding the next appointment.

BI-RADS CATEGORY  4: Suspicious.

## 2020-08-06 ENCOUNTER — Encounter (HOSPITAL_BASED_OUTPATIENT_CLINIC_OR_DEPARTMENT_OTHER): Payer: Self-pay | Admitting: Nurse Practitioner

## 2020-08-06 ENCOUNTER — Other Ambulatory Visit: Payer: Self-pay

## 2020-08-06 ENCOUNTER — Ambulatory Visit (INDEPENDENT_AMBULATORY_CARE_PROVIDER_SITE_OTHER): Payer: 59 | Admitting: Nurse Practitioner

## 2020-08-06 ENCOUNTER — Other Ambulatory Visit (HOSPITAL_BASED_OUTPATIENT_CLINIC_OR_DEPARTMENT_OTHER): Payer: Self-pay | Admitting: Nurse Practitioner

## 2020-08-06 VITALS — BP 98/59 | HR 80 | Ht 62.0 in | Wt 146.4 lb

## 2020-08-06 DIAGNOSIS — Z136 Encounter for screening for cardiovascular disorders: Secondary | ICD-10-CM | POA: Diagnosis not present

## 2020-08-06 DIAGNOSIS — Z13228 Encounter for screening for other metabolic disorders: Secondary | ICD-10-CM

## 2020-08-06 DIAGNOSIS — Z1321 Encounter for screening for nutritional disorder: Secondary | ICD-10-CM

## 2020-08-06 DIAGNOSIS — Z6826 Body mass index (BMI) 26.0-26.9, adult: Secondary | ICD-10-CM

## 2020-08-06 DIAGNOSIS — N926 Irregular menstruation, unspecified: Secondary | ICD-10-CM | POA: Diagnosis not present

## 2020-08-06 DIAGNOSIS — Z0000 Encounter for general adult medical examination without abnormal findings: Secondary | ICD-10-CM | POA: Insufficient documentation

## 2020-08-06 DIAGNOSIS — Z7689 Persons encountering health services in other specified circumstances: Secondary | ICD-10-CM | POA: Diagnosis not present

## 2020-08-06 DIAGNOSIS — Z1329 Encounter for screening for other suspected endocrine disorder: Secondary | ICD-10-CM | POA: Diagnosis not present

## 2020-08-06 DIAGNOSIS — Z130 Encounter for screening for diseases of the blood and blood-forming organs and certain disorders involving the immune mechanism: Secondary | ICD-10-CM

## 2020-08-06 NOTE — Patient Instructions (Addendum)
Recommendations from today's visit:  Lab Orders     Prolactin     17-Hydroxyprogesterone     TSH+FSH+TestT+LH+T3+DHEA-S+...     CBC w/Diff/Platelet     Comprehensive metabolic panel     Lipid panel     Hemoglobin A1c . We will plan for a full physical exam in about 2 weeks or whenever it is convenient for you. If you would like to have your pap done at that time, we can do that also.   Information on diet, exercise, and health maintenance recommendations are listed below. This is information to help you be sure you are on track for optimal health and monitoring.   Please look over this and let us know if you have any questions or if you have completed any of the health maintenance outside of [place] so that we can be sure your records are up to date.  ___________________________________________________________  Thank you for choosing [place] at [facility] for your Primary Care needs. I am excited for the opportunity to partner with you to meet your health care goals. It was a pleasure meeting you today!  I am an Adult-Geriatric Nurse Practitioner with a background in caring for patients for more than 20 years. I received my Paediatric nurse in Nursing and my Doctor of Nursing Practice degrees at [employer]. I received additional fellowship training in primary care and sports medicine after receiving my doctorate degree. I provide primary care and sports medicine services to patients age 67 and older within this office. I am also a provider with the [facility] and the director of the APP Fellowship with [facility].  I am a [place] native, but have called the [place] area home for nearly 20 years and am proud to be a member of this community.   I am passionate about providing the best service to you through preventive medicine and supportive care. I consider you a part of the medical team and value your input. I work  diligently to ensure that you are heard and your needs are met in a safe and effective manner. I want you to feel comfortable with me as your provider and want you to know that your health concerns are important to me.   For your information, our office hours are Monday- Friday 8:00 AM - 5:00 PM At this time I am not in the office on Wednesdays.  If you have questions or concerns, please call our office at [phone] or send [place] a MyChart message and we will respond as quickly as possible.   For all urgent or time sensitive needs we ask that you please call the office to avoid delays. MyChart is not constantly monitored and replies may take up to 72 business hours.  MyChart Policy: . MyChart allows for you to see your visit notes, after visit summary, provider recommendations, lab and tests results, make an appointment, request refills, and contact your provider or the office for non-urgent questions or concerns.  . Providers are seeing patients during normal business hours and do not have built in time to review MyChart messages. We ask that you allow a minimum of 72 business hours for MyChart message responses.  . Complex MyChart concerns may require a visit. Your provider may request you schedule a virtual or in person visit to ensure we are providing the best care possible. . MyChart messages sent after 4:00 PM on Friday will not be received by  the provider until Monday morning.    Lab and Test Results: . You will receive your lab and test results on MyChart as soon as they are completed and results have been sent by the lab or testing facility. Due to this service, you will receive your results BEFORE your provider.  . Please allow a minimum of 72 business hours for your provider to receive and review lab and test results and contact you about.   . Most lab and test result comments from the provider will be sent through [place]. Your provider may recommend changes to the plan of care,  follow-up visits, repeat testing, ask questions, or request an office visit to discuss these results. You may reply directly to this message or call the office at [phone] to provide information for the provider or set up an appointment. . In some instances, you will be called with test results and recommendations. Please let us know if this is preferred and we will make note of this in your chart to provide this for you.    . If you have not heard a response to your lab or test results in 72 business hours, please call the office to let us know.   After Hours: . For all non-emergency after hours needs, please call the office at [phone] and select the option to reach the on-call provider service. On-call services are shared between multiple [facility] offices and therefore it will not be possible to speak directly with your provider. On-call providers may provide medical advice and recommendations, but are unable to provide refills for maintenance medications.  . For all emergency or urgent medical needs after normal business hours, we recommend that you seek care at the closest Urgent Care or Emergency Department to ensure appropriate treatment in a timely manner.  Nigel Bridgeman [place] [facility] has a 24 hour emergency room located on the ground floor for your convenience.    Please do not hesitate to reach out to [place] with concerns.   Thank you, again, for choosing me as your health care partner. I appreciate your trust and look forward to learning more about you.   Worthy Keeler, DNP, AGNP-c ___________________________________________________________  Health Maintenance Recommendations Screening Testing  Mammogram  Every 1 -2 years based on history and risk factors  Starting at age 93  Pap Smear  Ages 21-39 every 3 years  Ages 55-65 every 5 years with HPV testing  More frequent testing may be required based on results and history  Colon Cancer Screening  Every  1-10 years based on test performed, risk factors, and history  Starting at age 28  Bone Density Screening  Every 2-10 years based on history  Starting at age 56 for women  Recommendations for men differ based on medication usage, history, and risk factors  AAA Screening  One time ultrasound  Men 54-35 years old who have every smoked  Lung Cancer Screening  Low Dose Lung CT every 12 months  Age 49-80 years with a 30 pack-year smoking history who still smoke or who have quit within the last 15 years  Screening Labs  Routine  Labs: Complete Blood Count (CBC), Complete Metabolic Panel (CMP), Cholesterol (Lipid Panel)  Every 6-12 months based on history and medications  May be recommended more frequently based on current conditions or previous results  Hemoglobin A1c Lab  Every 3-12 months based on history and previous results  Starting at age 43 or earlier with diagnosis of diabetes, high cholesterol,  BMI >26, and/or risk factors  Frequent monitoring for patients with diabetes to ensure blood sugar control  Thyroid Panel (TSH w/ T3 & T4)  Every 6 months based on history, symptoms, and risk factors  May be repeated more often if on medication  HIV  One time testing for all patients 55 and older  May be repeated more frequently for patients with increased risk factors or exposure  Hepatitis C  One time testing for all patients 107 and older  May be repeated more frequently for patients with increased risk factors or exposure  Gonorrhea, Chlamydia  Every 12 months for all sexually active persons 13-24 years  Additional monitoring may be recommended for those who are considered high risk or who have symptoms  PSA  Men 4-68 years old with risk factors  Additional screening may be recommended from age 41-69 based on risk factors, symptoms, and history  Vaccine Recommendations  Tetanus Booster  All adults every 10 years  Flu Vaccine  All patients 6  months and older every year  COVID Vaccine  All patients 12 years and older  Initial dosing with booster  May recommend additional booster based on age and health history  HPV Vaccine  2 doses all patients age 74-26  Dosing may be considered for patients over 26  Shingles Vaccine (Shingrix)  2 doses all adults 18 years and older  Pneumonia (Pneumovax 23)  All adults 39 years and older  May recommend earlier dosing based on health history  Pneumonia (Prevnar 41)  All adults 27 years and older  Dosed 1 year after Pneumovax 23  Additional Screening, Testing, and Vaccinations may be recommended on an individualized basis based on family history, health history, risk factors, and/or exposure.  __________________________________________________________  Diet Recommendations for All Patients  I recommend that all patients maintain a diet low in saturated fats, carbohydrates, and cholesterol. While this can be challenging at first, it is not impossible and small changes can make big differences.  Things to try: Marland Kitchen Decreasing the amount of soda, sweet tea, and/or juice to one or less per day and replace with water o While water is always the first choice, if you do not like water you may consider - adding a water additive without sugar to improve the taste - other sugar free drinks . Replace potatoes with a brightly colored vegetable at dinner . Use healthy oils, such as canola oil or olive oil, instead of butter or hard margarine . Limit your bread intake to two pieces or less a day . Replace regular pasta with low carb pasta options . Bake, broil, or grill foods instead of frying . Monitor portion sizes  . Eat smaller, more frequent meals throughout the day instead of large meals  An important thing to remember is, if you love foods that are not great for your health, you don't have to give them up completely. Instead, allow these foods to be a reward when you have done  well. Allowing yourself to still have special treats every once in a while is a nice way to tell yourself thank you for working hard to keep yourself healthy.   Also remember that every day is a new day. If you have a bad day and "fall off the wagon", you can still climb right back up and keep moving along on your journey!  We have resources available to help you!  Some websites that may be helpful include: . www.http://carter.biz/  . Www.VeryWellFit.com _____________________________________________________________  Activity Recommendations for All Patients  I recommend that all adults get at least 20 minutes of moderate physical activity that elevates your heart rate at least 5 days out of the week.  Some examples include: . Walking or jogging at a pace that allows you to carry on a conversation . Cycling (stationary bike or outdoors) . Water aerobics . Yoga . Weight lifting . Dancing If physical limitations prevent you from putting stress on your joints, exercise in a pool or seated in a chair are excellent options.  Do determine your MAXIMUM heart rate for activity: YOUR AGE - 220 = MAX HeartRate   Remember! . Do not push yourself too hard.  . Start slowly and build up your pace, speed, weight, time in exercise, etc.  . Allow your body to rest between exercise and get good sleep. . You will need more water than normal when you are exerting yourself. Do not wait until you are thirsty to drink. Drink with a purpose of getting in at least 8, 8 ounce glasses of water a day plus more depending on how much you exercise and sweat.    If you begin to develop dizziness, chest pain, abdominal pain, jaw pain, shortness of breath, headache, vision changes, lightheadedness, or other concerning symptoms, stop the activity and allow your body to rest. If your symptoms are severe, seek emergency evaluation immediately. If your symptoms are concerning, but not severe, please let us know so that we can  recommend further evaluation.   ________________________________________________________________   Polycystic Ovary Syndrome  Polycystic ovarian syndrome (PCOS) is a common hormonal disorder among women of reproductive age. In most women with PCOS, small fluid-filled sacs (cysts) grow on the ovaries. PCOS can cause problems with menstrual periods and make it hard to get and stay pregnant. If this condition is not treated, it can lead to serious health problems, such as diabetes and heart disease. What are the causes? The cause of this condition is not known. It may be due to certain factors, such as:  Irregular menstrual cycle.  High levels of certain hormones.  Problems with the hormone that helps to control blood sugar (insulin).  Certain genes. What increases the risk? You are more likely to develop this condition if you:  Have a family history of PCOS or type 2 diabetes.  Are overweight, eat unhealthy foods, and are not active. These factors may cause problems with blood sugar control, which can contribute to PCOS or PCOS symptoms. What are the signs or symptoms? Symptoms of this condition include:  Ovarian cysts and sometimes pelvic pain.  Menstrual periods that are not regular or are too heavy.  Inability to get or stay pregnant.  Increased growth of hair on the face, chest, stomach, back, thumbs, thighs, or toes.  Acne or oily skin. Acne may develop during adulthood, and it may not get better with treatment.  Weight gain or obesity.  Patches of thickened and dark brown or black skin on the neck, arms, breasts, or thighs. How is this diagnosed? This condition is diagnosed based on:  Your medical history.  A physical exam that includes a pelvic exam. Your health care provider may look for areas of increased hair growth on your skin.  Tests, such as: ? An ultrasound to check the ovaries for cysts and to view the lining of the uterus. ? Blood tests to check levels  of sugar (glucose), female hormone (testosterone), and female hormones (estrogen and progesterone). How is this treated?  There is no cure for this condition, but treatment can help to manage symptoms and prevent more health problems from developing. Treatment varies depending on your symptoms and if you want to have a baby or if you need birth control. Treatment may include:  Making nutrition and lifestyle changes.  Taking the progesterone hormone to start a menstrual period.  Taking birth control pills to help you have regular menstrual periods.  Taking medicines such as: ? Medicines to make you ovulate, if you want to get pregnant. ? Medicine to reduce extra hair growth.  Having surgery in severe cases. This may involve making small holes in one or both of your ovaries. This decreases the amount of testosterone that your body makes. Follow these instructions at home:  Take over-the-counter and prescription medicines only as told by your health care provider.  Follow a healthy meal plan that includes lean proteins, complex carbohydrates, fresh fruits and vegetables, low-fat dairy products, healthy fats, and fiber.  If you are overweight, lose weight as told by your health care provider. Your health care provider can determine how much weight loss is best for you and can help you lose weight safely.  Keep all follow-up visits. This is important. Contact a health care provider if:  Your symptoms do not get better with medicine.  Your symptoms get worse or you develop new symptoms. Summary  Polycystic ovarian syndrome (PCOS) is a common hormonal disorder among women of reproductive age.  PCOS can cause problems with menstrual periods and make it hard to get and stay pregnant.  If this condition is not treated, it can lead to serious health problems, such as diabetes and heart disease.  There is no cure for this condition, but treatment can help to manage symptoms and prevent more  health problems from developing. This information is not intended to replace advice given to you by your health care provider. Make sure you discuss any questions you have with your health care provider. Document Revised: 07/31/2019 Document Reviewed: 07/31/2019 Elsevier Patient Education  2021 [place].   Abnormal Uterine Bleeding Abnormal uterine bleeding means bleeding more than usual from your womb (uterus). It can include:  Bleeding between menstrual periods.  Bleeding after sex.  Bleeding that is heavier than normal.  Menstrual periods that last longer than usual.  Bleeding after you have stopped having your menstrual period (menopause). There are many problems that may cause this. You should see a doctor for any kind of bleeding that is not normal. Treatment depends on the cause of the bleeding. Follow these instructions at home: Medicines  Take over-the-counter and prescription medicines only as told by your doctor.  Tell your doctor about other medicines that you take. ? If told by your doctor, stop taking aspirin or medicines that have aspirin in them. These medicines can make you bleed more.  You may be given iron pills to replace iron that your body loses because of this condition. Take them as told by your doctor. Managing constipation If you are taking iron pills, you may have trouble pooping (constipation). To prevent or treat trouble pooping, you may need to:  Drink enough fluid to keep your pee (urine) pale yellow.  Take over-the-counter or prescription medicines.  Eat foods that are high in fiber. These include beans, whole grains, and fresh fruits and vegetables.  Limit foods that are high in fat and sugar. These include fried or sweet foods. General instructions  Watch your condition for any changes.  Do  not use tampons, douche, or have sex, if your doctor tells you not to.  Change your pads often.  Get regular exams. This includes pelvic exams  and cervical cancer screenings. ? It is up to you to get the results of any tests that are done. Ask your doctor, or the department that is doing the tests, when your results will be ready.  Keep all follow-up visits as told by your doctor. This is important. Contact a doctor if:  The bleeding lasts more than 1 week.  You feel dizzy at times.  You feel like you may vomit (nausea).  You vomit.  You feel light-headed or weak.  Your symptoms get worse. Get help right away if:  You pass out.  You have to change pads every hour.  You have pain in your belly.  You have a fever or chills.  You get sweaty.  You get weak.  You pass large blood clots from your vagina. Summary  Abnormal uterine bleeding means bleeding more than usual from your womb (uterus).  Any kind of bleeding that is not normal should be checked by a doctor.  Treatment depends on the cause of the bleeding.  Get help right away if you pass out, you have to change pads every hour, or you pass large blood clots from your vagina. This information is not intended to replace advice given to you by your health care provider. Make sure you discuss any questions you have with your health care provider. Document Revised: 12/24/2018 Document Reviewed: 12/24/2018 Elsevier Patient Education  [place].

## 2020-08-06 NOTE — Assessment & Plan Note (Signed)
Review of current and past medical history, social history, medication, and family history.  Review of care gaps and health maintenance recommendations.  Records from recent providers to be requested if not available in Chart Review or Care Everywhere.  Recommendations for health maintenance, diet, and exercise provided.   

## 2020-08-06 NOTE — Assessment & Plan Note (Signed)
Menstrual cycle changes with increased acne and emotional changes of unknown etiology Patient has concerns for PCOS or possible thyroid etiology Will obtain labs today for further evaluation F/U in 2-3 weeks for CPE

## 2020-08-06 NOTE — Progress Notes (Signed)
Denise Clamp, DNP, AGNP-c Primary Care Services ______________________________________________________________________________________________________________________________________________  HPI Denise Drake is a 35 y.o. female presenting to [facility] [place] [facility] Primary Care today to establish care.   Patient Care Team: Breann Losano, Sung Amabile, NP as PCP - General (Nurse Practitioner)   Concerns today: . Acne o Noticed over the last 3 months an increase in acne on her face that has not been present before.  o Painful  o Leaving scars o Not on any hormonal contraception, has regular menstrual cycles but the length of bleedings has decreased from four to three days.  o LMP 06/01 o Concerned for hormonal irregularities or possible PCOS .    Narrative: Denise Drake is married with one child, she is almost 35 years old.  She reports she is safe in his current relationships and home environment.  She is currently currently employed. Adjunct chemistry instructor at [employer] She endorses walking and exercise daily and normal diet.  PHQ9 Today: Depression screen [facility] 2/9 08/06/2020 02/12/2018 01/15/2017  Decreased Interest 0 0 0  Down, Depressed, Hopeless 0 0 0  PHQ - 2 Score 0 0 0  Altered sleeping 0 - -  Tired, decreased energy 0 - -  Change in appetite 0 - -  Feeling bad or failure about yourself  0 - -  Trouble concentrating 0 - -  Moving slowly or fidgety/restless 0 - -  Suicidal thoughts 0 - -  PHQ-9 Score 0 - -   GAD7 Today: GAD 7 : Generalized Anxiety Score 08/06/2020  Nervous, Anxious, on Edge 0  Control/stop worrying 0  Worry too much - different things 0  Trouble relaxing 0  Restless 0  Easily annoyed or irritable 0  Afraid - awful might happen 0  Total GAD 7 Score 0    Health Maintenance Due  Topic Date Due  . Hepatitis C Screening  Never done  . PAP SMEAR-Modifier  02/14/2020     PMH Past Medical History:   Diagnosis Date  . Gestational diabetes     ROS All review of systems negative except what is listed in the HPI  PHYSICAL EXAM General appearance: alert, cooperative and no distress, Resp: clear to auscultation bilaterally, Cardio: regular rate and rhythm, S1, S2 normal, no murmur, click, rub or gallop, GI: soft, non-tender; bowel sounds normal; no masses,  no organomegaly and Extremities: extremities normal, atraumatic, no cyanosis or edema   ASSESSMENT AND PLAN Problem List Items Addressed This Visit   None   Visit Diagnoses    Menses, irregular    -  Primary   Relevant Orders   Prolactin   17-Hydroxyprogesterone   TSH+FSH+TestT+LH+T3+DHEA-S+...   CBC w/Diff/Platelet   Comprehensive metabolic panel   Lipid panel   Hemoglobin A1c   Encounter for screening for cardiovascular disorders       Relevant Orders   Comprehensive metabolic panel   Lipid panel   Screening for endocrine, nutritional, metabolic and immunity disorder       Relevant Orders   Prolactin   17-Hydroxyprogesterone   TSH+FSH+TestT+LH+T3+DHEA-S+...   Comprehensive metabolic panel   Lipid panel   Hemoglobin A1c      Education provided today during visit and on AVS for patient to review at home.  Diet and Exercise recommendations provided.  Current diagnoses and recommendations discussed. HM recommendations reviewed with recommendations.    No outpatient encounter medications on file as of 08/06/2020.   No facility-administered encounter medications on file as of 08/06/2020.  Return in about 2 weeks (around 08/20/2020) for CPE with Pap.  Time: 55 minutes, >50% spent counseling, care coordination, chart review, and documentation.   Tollie Eth, DNP, AGNP-c

## 2020-08-11 LAB — COMPREHENSIVE METABOLIC PANEL
ALT: 9 IU/L (ref 0–32)
AST: 14 IU/L (ref 0–40)
Albumin/Globulin Ratio: 1.4 (ref 1.2–2.2)
Albumin: 4 g/dL (ref 3.8–4.8)
Alkaline Phosphatase: 64 IU/L (ref 44–121)
BUN/Creatinine Ratio: 14 (ref 9–23)
BUN: 10 mg/dL (ref 6–20)
Bilirubin Total: 0.5 mg/dL (ref 0.0–1.2)
CO2: 23 mmol/L (ref 20–29)
Calcium: 8.7 mg/dL (ref 8.7–10.2)
Chloride: 101 mmol/L (ref 96–106)
Creatinine, Ser: 0.74 mg/dL (ref 0.57–1.00)
Globulin, Total: 2.8 g/dL (ref 1.5–4.5)
Glucose: 87 mg/dL (ref 65–99)
Potassium: 4.1 mmol/L (ref 3.5–5.2)
Sodium: 136 mmol/L (ref 134–144)
Total Protein: 6.8 g/dL (ref 6.0–8.5)
eGFR: 108 mL/min/{1.73_m2} (ref >59)

## 2020-08-11 LAB — CBC WITH DIFFERENTIAL/PLATELET
Basophils Absolute: 0 10*3/uL (ref 0.0–0.2)
Basos: 0 %
EOS (ABSOLUTE): 0.1 10*3/uL (ref 0.0–0.4)
Eos: 1 %
Hematocrit: 32.6 % — ABNORMAL LOW (ref 34.0–46.6)
Hemoglobin: 10.9 g/dL — ABNORMAL LOW (ref 11.1–15.9)
Immature Grans (Abs): 0 10*3/uL (ref 0.0–0.1)
Immature Granulocytes: 0 %
Lymphocytes Absolute: 1.6 10*3/uL (ref 0.7–3.1)
Lymphs: 29 %
MCH: 26.8 pg (ref 26.6–33.0)
MCHC: 33.4 g/dL (ref 31.5–35.7)
MCV: 80 fL (ref 79–97)
Monocytes Absolute: 0.3 10*3/uL (ref 0.1–0.9)
Monocytes: 6 %
Neutrophils Absolute: 3.4 10*3/uL (ref 1.4–7.0)
Neutrophils: 64 %
Platelets: 218 10*3/uL (ref 150–450)
RBC: 4.07 x10E6/uL (ref 3.77–5.28)
RDW: 13.4 % (ref 11.7–15.4)
WBC: 5.4 10*3/uL (ref 3.4–10.8)

## 2020-08-11 LAB — TSH+FSH+TESTT+LH+T3+DHEA-S+...
DHEA-SO4: 124 ug/dL (ref 57.3–279.2)
Estradiol: 56.2 pg/mL
FSH: 7.7 m[IU]/mL
LH: 10.1 m[IU]/mL
Progesterone: 0.3 ng/mL
T3, Total: 158 ng/dL (ref 71–180)
TSH: 1.77 u[IU]/mL (ref 0.450–4.500)
Testosterone, Free: <0.2 pg/mL (ref 0.0–4.2)
Testosterone: 15 ng/dL (ref 8–60)

## 2020-08-11 LAB — LIPID PANEL
Chol/HDL Ratio: 2.6 ratio (ref 0.0–4.4)
Cholesterol, Total: 134 mg/dL (ref 100–199)
HDL: 52 mg/dL (ref >39)
LDL Chol Calc (NIH): 67 mg/dL (ref 0–99)
Triglycerides: 74 mg/dL (ref 0–149)
VLDL Cholesterol Cal: 15 mg/dL (ref 5–40)

## 2020-08-11 LAB — PROLACTIN: Prolactin: 12 ng/mL (ref 4.8–23.3)

## 2020-08-11 LAB — HEMOGLOBIN A1C
Est. average glucose Bld gHb Est-mCnc: 94 mg/dL
Hgb A1c MFr Bld: 4.9 % (ref 4.8–5.6)

## 2020-08-11 LAB — 17-HYDROXYPROGESTERONE: 17-Hydroxyprogesterone: 23 ng/dL

## 2020-08-11 NOTE — Progress Notes (Signed)
Please notify patient:  Labs look very good other than anemia. Hemoglobin is down to 10.9- recommend supplement of over the counter ferrous sulfate (iron) every Monday, Wednesday, and Friday to help improve anemia.   No signs of thyroid dysfunction.  No signs of PCOS or hormonal concerns.  Kidneys, liver, and electrolytes look great No diabetes or pre-diabetes. Cholesterol is fantastic!  Consider possible increased acne from masks. Recommend washing face with mild, gentle cleaner with benzoyl peroxide or salicylic acid. Use scent free facial moisturizer to prevent drying of the skin.   If not planning pregnancy, can consider birth control pill, which can be helpful for acne.

## 2020-08-18 ENCOUNTER — Telehealth (HOSPITAL_BASED_OUTPATIENT_CLINIC_OR_DEPARTMENT_OTHER): Payer: Self-pay

## 2020-08-18 NOTE — Telephone Encounter (Signed)
Left message for patient to call back for results and recommendations. 

## 2020-08-18 NOTE — Telephone Encounter (Signed)
-----   Message from Sara E Early, NP sent at 08/11/2020  3:28 PM EDT ----- Please notify patient:  Labs look very good other than anemia. Hemoglobin is down to 10.9- recommend supplement of over the counter ferrous sulfate (iron) 325mcg every Monday, Wednesday, and Friday to help improve anemia.   No signs of thyroid dysfunction.  No signs of PCOS or hormonal concerns.  Kidneys, liver, and electrolytes look great No diabetes or pre-diabetes. Cholesterol is fantastic!  Consider possible increased acne from masks. Recommend washing face with mild, gentle cleaner with benzoyl peroxide or salicylic acid. Use scent free facial moisturizer to prevent drying of the skin.   If not planning pregnancy, can consider birth control pill, which can be helpful for acne.  

## 2020-08-19 ENCOUNTER — Telehealth (HOSPITAL_BASED_OUTPATIENT_CLINIC_OR_DEPARTMENT_OTHER): Payer: Self-pay

## 2020-08-19 NOTE — Telephone Encounter (Signed)
Left message for patient to call back for results and recommendations. 

## 2020-08-19 NOTE — Telephone Encounter (Signed)
-----   Message from Sara E Early, NP sent at 08/11/2020  3:28 PM EDT ----- Please notify patient:  Labs look very good other than anemia. Hemoglobin is down to 10.9- recommend supplement of over the counter ferrous sulfate (iron) 325mcg every Monday, Wednesday, and Friday to help improve anemia.   No signs of thyroid dysfunction.  No signs of PCOS or hormonal concerns.  Kidneys, liver, and electrolytes look great No diabetes or pre-diabetes. Cholesterol is fantastic!  Consider possible increased acne from masks. Recommend washing face with mild, gentle cleaner with benzoyl peroxide or salicylic acid. Use scent free facial moisturizer to prevent drying of the skin.   If not planning pregnancy, can consider birth control pill, which can be helpful for acne.  

## 2020-08-19 NOTE — Telephone Encounter (Signed)
Called patient to discuss lab results and recommendations.  Patient is aware and agrees.  Instructed her to contact the office with questions or concerns.

## 2020-08-19 NOTE — Telephone Encounter (Signed)
-----   Message from Tollie Eth, NP sent at 08/11/2020  3:28 PM EDT ----- Please notify patient:  Labs look very good other than anemia. Hemoglobin is down to 10.9- recommend supplement of over the counter ferrous sulfate (iron) every Monday, Wednesday, and Friday to help improve anemia.   No signs of thyroid dysfunction.  No signs of PCOS or hormonal concerns.  Kidneys, liver, and electrolytes look great No diabetes or pre-diabetes. Cholesterol is fantastic!  Consider possible increased acne from masks. Recommend washing face with mild, gentle cleaner with benzoyl peroxide or salicylic acid. Use scent free facial moisturizer to prevent drying of the skin.   If not planning pregnancy, can consider birth control pill, which can be helpful for acne.

## 2020-08-25 ENCOUNTER — Encounter: Payer: BLUE CROSS/BLUE SHIELD | Admitting: Family Medicine

## 2020-08-30 ENCOUNTER — Other Ambulatory Visit: Payer: Self-pay

## 2020-08-30 ENCOUNTER — Encounter (HOSPITAL_BASED_OUTPATIENT_CLINIC_OR_DEPARTMENT_OTHER): Payer: Self-pay | Admitting: Nurse Practitioner

## 2020-08-30 ENCOUNTER — Ambulatory Visit (INDEPENDENT_AMBULATORY_CARE_PROVIDER_SITE_OTHER): Payer: 59 | Admitting: Nurse Practitioner

## 2020-08-30 VITALS — BP 102/56 | HR 84 | Ht 63.0 in | Wt 146.8 lb

## 2020-08-30 DIAGNOSIS — Z124 Encounter for screening for malignant neoplasm of cervix: Secondary | ICD-10-CM

## 2020-08-30 DIAGNOSIS — Z0000 Encounter for general adult medical examination without abnormal findings: Secondary | ICD-10-CM

## 2020-08-30 NOTE — Addendum Note (Signed)
Addended by: Marquet Faircloth, Huntley Dec E on: 08/30/2020 11:29 AM   Modules accepted: Orders

## 2020-08-30 NOTE — Patient Instructions (Addendum)
We will let you know the results of your pap test through MyChart or call you if there are any concerns we need to discuss.   If you would like to consider getting pregnant in the future, please let me know and we can discuss options that may be helpful.    Recent Labs: Labs look very good other than anemia (information attached at back) Hemoglobin is down to 10.9- recommend supplement of over the counter ferrous sulfate (iron) every Monday, Wednesday, and Friday to help improve anemia.    No signs of thyroid dysfunction.  No signs of PCOS or hormonal concerns.  Kidneys, liver, and electrolytes look great No diabetes or pre-diabetes. Cholesterol is fantastic!   Consider possible increased acne from masks (information attached at back).  If not planning pregnancy, can consider birth control pill, which can behelpful for acne.   Recommendations: We are seeing in influx in facial acne in the areas where masks are worn by many women.  We recommend changing your face at least once mask daily and avoid touching the inside of the mask when putting on and taking off to prevent contaminates from the hands getting onto the area that touches your face.  If you are sweating in the mask, we recommend that you change it more often than once per day to prevent clogging of the pores on the face due to pressure.   Wash face twice a day with a gentle cleanser.  Cleansers containing salicylic acid help remove the top layer of dead skin cells and can help to prevent clogged pores.  Benzoyl peroxide can be applied to areas of breakout to kill the bacteria present. This can be very drying and can make your skin sensitive to sun, be sure to use a good moisturizer (non-clogging) and wear sunscreen for the face if you are out in the sun.   In some women, acne is caused by tinea, an overgrowth of fungus that is found on the skin. The moist environment under the mask can sometimes aid in this growth. You may  want to consider washing your face once a day with Nizoral shampoo until acne resolves then maintain with use three times a week to prevent regrowth. This is found over the counter in the area with Dandruff shampoo. This can also be helpful to use in other places where acne often forms,  if you find that you have increased acne growth around your hairline and/or on your chest and back.  Be sure to use a gentle non-clogging moisturizer after use.

## 2020-08-30 NOTE — Progress Notes (Addendum)
BP (!) 102/56   Pulse 84   Ht _0  (1.6 m)   Wt 146 lb 12.8 oz (66.6 kg)   SpO2 100%   BMI 26.00 kg/m    Subjective:    Patient ID: Denise Drake, female    DOB: Dec 30, 1985, 35 y.o.   MRN: [id number]  HPI: Denise Drake is a 35 y.o. female presenting on 08/30/2020 for comprehensive medical examination.   Current medical concerns include:none  Past Medical History:  Past Medical History:  Diagnosis Date   Gestational diabetes    Medications:  No current outpatient medications on file prior to visit.   No current facility-administered medications on file prior to visit.    She currently lives with: Interim Problems from her last visit: no   She reports regular vision exams q1-5y:  no routine vision exams- no vision concerns She reports regular dental exams q 75m  last dental exam 2019 Her diet consists of:  overall healthy- high in vegetables. Eats chicken and fish. No red meat. She endorses exercise and/or activity of:  walking and exercise about 2 hours per week She works at:  Adjunct Professor at [employer]  She denies ETOH use She denies nictoine use  She denies illegal substance use  She is having regular monthly periods with no concerns with heavy bleeding, pain, or prolonged bleeding.  Current menopausal symptoms: no   She is currently sexually active with single partner, contraception - none She denies concerns today about STI  She endorses concerns about skin changes today: increased acne She denies concerns about bowel changes today She denies concerns about bladder changes today  Depression Screen done today and results listed below:  [facility] 08/06/2020 02/12/2018 01/15/2017  Decreased Interest 0 0 0  Down, Depressed, Hopeless 0 0 0  PHQ - 2 Score 0 0 0  Altered sleeping 0 - -  Tired, decreased energy 0 - -  Change in appetite 0 - -  Feeling bad or failure about yourself  0 - -  Trouble concentrating 0 - -  Moving  slowly or fidgety/restless 0 - -  Suicidal thoughts 0 - -  PHQ-9 Score 0 - -   Anxiety Screening done GAD 7 : Generalized Anxiety Score 08/06/2020  Nervous, Anxious, on Edge 0  Control/stop worrying 0  Worry too much - different things 0  Trouble relaxing 0  Restless 0  Easily annoyed or irritable 0  Afraid - awful might happen 0  Total GAD 7 Score 0    She does not have a history of falls. I did complete a risk assessment for falls. A plan of care for falls was documented. Fall Risk  08/06/2020  Falls in the past year? 0  Number falls in past yr: 0  Injury with Fall? 0  Risk for fall due to : No Fall Risks  Follow up Falls evaluation completed      Surgical History:  History reviewed. No pertinent surgical history.  Allergies:  Allergies  Allergen Reactions   Eucalyptus Oil Rash    Social History:  Social History   Socioeconomic History   Marital status: Married    Spouse name: Not on file   Number of children: 1   Years of education: Not on file   Highest education level: Professional school degree (e.g., MD, DDS, DVM, JD)  Occupational History   Occupation: Chemistry Professor  Tobacco Use   Smoking status: Never   Smokeless tobacco: Never  Vaping  Use   Vaping Use: Never used  Substance and Sexual Activity   Alcohol use: No   Drug use: No   Sexual activity: Yes    Birth control/protection: Condom  Other Topics Concern   Not on file  Social History Narrative   Not on file   Social Determinants of Health   Financial Resource Strain: Not on file  Food Insecurity: Not on file  Transportation Needs: Not on file  Physical Activity: Not on file  Stress: Not on file  Social Connections: Not on file  Intimate Partner Violence: Not At Risk   Fear of Current or Ex-Partner: No   Emotionally Abused: No   Physically Abused: No   Sexually Abused: No   Social History   Tobacco Use  Smoking Status Never  Smokeless Tobacco Never   Social History    Substance and Sexual Activity  Alcohol Use No    Family History:  Family History  Problem Relation Age of Onset   Diabetes Paternal Grandfather     Past medical history, surgical history, medications, allergies, family history and social history reviewed with patient today and changes made to appropriate areas of the chart.   All ROS negative except what is listed above and in the HPI.      Objective:    BP (!) 102/56   Pulse 84   Ht _0  (1.6 m)   Wt 146 lb 12.8 oz (66.6 kg)   SpO2 100%   BMI 26.00 kg/m   Wt Readings from Last 3 Encounters:  08/30/20 146 lb 12.8 oz (66.6 kg)  08/06/20 146 lb 6.4 oz (66.4 kg)  02/12/18 156 lb 4 oz (70.9 kg)    Physical Exam Vitals and nursing note reviewed. Exam conducted with a chaperone present.  Constitutional:      Appearance: Normal appearance. She is normal weight.  HENT:     Head: Normocephalic and atraumatic.     Right Ear: Tympanic membrane normal.     Left Ear: Tympanic membrane normal.     Nose: Nose normal.     Mouth/Throat:     Mouth: Mucous membranes are moist.     Pharynx: Oropharynx is clear.  Eyes:     Extraocular Movements: Extraocular movements intact.     Conjunctiva/sclera: Conjunctivae normal.     Pupils: Pupils are equal, round, and reactive to light.  Neck:     Vascular: No carotid bruit.  Cardiovascular:     Rate and Rhythm: Normal rate and regular rhythm.     Pulses: Normal pulses.     Heart sounds: Normal heart sounds.  Pulmonary:     Effort: Pulmonary effort is normal.     Breath sounds: Normal breath sounds.  Chest:     Chest wall: No mass, lacerations, deformity, swelling or tenderness.  Breasts:    Tanner Score is 5.     Breasts are symmetrical.     Right: Normal. No axillary adenopathy or supraclavicular adenopathy.     Left: Normal. No axillary adenopathy or supraclavicular adenopathy.     Comments: Breast tissue dense and fibrotic bilaterally with no concerning findings present.   Abdominal:     General: Abdomen is flat. Bowel sounds are normal. There is no distension.     Palpations: Abdomen is soft.     Tenderness: There is no abdominal tenderness. There is no right CVA tenderness, left CVA tenderness, guarding or rebound.  Genitourinary:    General: Normal vulva.  Exam position: Lithotomy position.     Pubic Area: No rash or pubic lice.      Tanner stage (genital): 5.     Labia:        Right: No tenderness or lesion.        Left: No tenderness or lesion.      Urethra: No prolapse or urethral swelling.     Vagina: Normal.     Cervix: No cervical motion tenderness.     Uterus: Normal.      Adnexa: Right adnexa normal and left adnexa normal.  Musculoskeletal:        General: Normal range of motion.     Cervical back: Normal range of motion and neck supple. No tenderness.     Right lower leg: No edema.     Left lower leg: Edema present.  Lymphadenopathy:     Cervical: No cervical adenopathy.     Upper Body:     Right upper body: No supraclavicular, axillary or pectoral adenopathy.     Left upper body: No supraclavicular, axillary or pectoral adenopathy.     Lower Body: No right inguinal adenopathy. No left inguinal adenopathy.  Skin:    General: Skin is warm and dry.     Capillary Refill: Capillary refill takes less than 2 seconds.  Neurological:     General: No focal deficit present.     Mental Status: She is alert and oriented to person, place, and time.     Cranial Nerves: No cranial nerve deficit.     Sensory: No sensory deficit.     Motor: No weakness.     Coordination: Coordination normal.     Gait: Gait normal.  Psychiatric:        Mood and Affect: Mood normal.        Behavior: Behavior normal.        Thought Content: Thought content normal.        Judgment: Judgment normal.    Results for orders placed or performed in visit on 08/06/20  TSH+FSH+TestT+LH+T3+DHEA-S+...  Result Value Ref Range   TSH 1.770 0.450 - 4.500 uIU/mL   T3,  Total 158 71 - 180 ng/dL   Testosterone 15 8 - 60 ng/dL   Testosterone, Free <0.2 0.0 - 4.2 pg/mL   LH 10.1 mIU/mL   FSH 7.7 mIU/mL   Progesterone 0.3 ng/mL   DHEA-SO4 124.0 57.3 - 279.2 ug/dL   Estradiol 56.2 pg/mL  CBC with Differential/Platelet  Result Value Ref Range   WBC 5.4 3.4 - 10.8 x10E3/uL   RBC 4.07 3.77 - 5.28 x10E6/uL   Hemoglobin 10.9 (L) 11.1 - 15.9 g/dL   Hematocrit 32.6 (L) 34.0 - 46.6 %   MCV 80 79 - 97 fL   MCH 26.8 26.6 - 33.0 pg   MCHC 33.4 31.5 - 35.7 g/dL   RDW 13.4 11.7 - 15.4 %   Platelets 218 150 - 450 x10E3/uL   Neutrophils 64 Not Estab. %   Lymphs 29 Not Estab. %   Monocytes 6 Not Estab. %   Eos 1 Not Estab. %   Basos 0 Not Estab. %   Neutrophils Absolute 3.4 1.4 - 7.0 x10E3/uL   Lymphocytes Absolute 1.6 0.7 - 3.1 x10E3/uL   Monocytes Absolute 0.3 0.1 - 0.9 x10E3/uL   EOS (ABSOLUTE) 0.1 0.0 - 0.4 x10E3/uL   Basophils Absolute 0.0 0.0 - 0.2 x10E3/uL   Immature Granulocytes 0 Not Estab. %   Immature Grans (Abs) 0.0 0.0 - 0.1  x10E3/uL  Comprehensive metabolic panel  Result Value Ref Range   Glucose 87 65 - 99 mg/dL   BUN 10 6 - 20 mg/dL   Creatinine, Ser 0.74 0.57 - 1.00 mg/dL   eGFR 108 >59 mL/min/1.73   BUN/Creatinine Ratio 14 9 - 23   Sodium 136 134 - 144 mmol/L   Potassium 4.1 3.5 - 5.2 mmol/L   Chloride 101 96 - 106 mmol/L   CO2 23 20 - 29 mmol/L   Calcium 8.7 8.7 - 10.2 mg/dL   Total Protein 6.8 6.0 - 8.5 g/dL   Albumin 4.0 3.8 - 4.8 g/dL   Globulin, Total 2.8 1.5 - 4.5 g/dL   Albumin/Globulin Ratio 1.4 1.2 - 2.2   Bilirubin Total 0.5 0.0 - 1.2 mg/dL   Alkaline Phosphatase 64 44 - 121 IU/L   AST 14 0 - 40 IU/L   ALT 9 0 - 32 IU/L  Lipid panel  Result Value Ref Range   Cholesterol, Total 134 100 - 199 mg/dL   Triglycerides 74 0 - 149 mg/dL   HDL 52 >39 mg/dL   VLDL Cholesterol Cal 15 5 - 40 mg/dL   LDL Chol Calc (NIH) 67 0 - 99 mg/dL   Chol/HDL Ratio 2.6 0.0 - 4.4 ratio  Hemoglobin A1c  Result Value Ref Range   Hgb A1c MFr  Bld 4.9 4.8 - 5.6 %   Est. average glucose Bld gHb Est-mCnc 94 mg/dL  17-Hydroxyprogesterone  Result Value Ref Range   17-Hydroxyprogesterone 23 ng/dL  Prolactin  Result Value Ref Range   Prolactin 12.0 4.8 - 23.3 ng/mL      Assessment & Plan:   Problem List Items Addressed This Visit     Encounter for annual physical exam - Primary   Encounter for Papanicolaou smear for cervical cancer screening  CPE without any concerning findings today.  Discussion of acne with patient and over the counter recommendations.  Discussion of fertility awareness- at this time she is not considering pregnancy, but will notify if plans change Discussion of anemia- historical low hemoglobin with no red meats in diet- information and recommendations provided.  UTD on screenings Will plan to start annual mammograms at 40 unless indicated sooner.  Recommend: Dental visits every 6 months.  Daily exercise of at least 20 minutes per day Pap with reflex HPV completed today  Follow up plan: Return in about 6 months (around 03/01/2021) for Anemia- repeat CBC.   LABORATORY TESTING:  - Pap smear: pap done - STI testing: deferred  IMMUNIZATIONS:   - Tdap: Tetanus vaccination status reviewed: last tetanus booster within 10 years. - Influenza: Postponed to flu season - Pneumovax: Not applicable - Prevnar: Not applicable - HPV: Not applicable - Zostavax vaccine: Not applicable  SCREENING: -Mammogram: Not applicable  - Colonoscopy: Not applicable  - Bone Density: Not applicable  -Hearing Test: Not applicable  -Spirometry: Not applicable   PATIENT COUNSELING:   For all adult patients, I recommend   A well balanced diet low in saturated fats, cholesterol, and moderation in carbohydrates.   This can be as simple as monitoring portion sizes and cutting back on sugary beverages such as soda and  juice to start with.    Daily water consumption of at least 64 ounces.  Physical activity at least 180  minutes per week, if just starting out.   This can be as simple as taking the stairs instead of the elevator and walking 2-3 laps around the office  purposefully every day.  STD protection, partner selection, and regular testing if high risk.  Limited consumption of alcoholic beverages if alcohol is consumed.  For women, I recommend no more than 7 alcoholic beverages per week, spread out throughout the week.  Avoid "binge" drinking or consuming large quantities of alcohol in one setting.   Please let me know if you feel you may need help with reduction or quitting alcohol consumption.   Avoidance of nicotine, if used.  Please let me know if you feel you may need help with reduction or quitting nicotine use.   Daily mental health attention.  This can be in the form of 5 minute daily meditation, prayer, journaling, yoga, reflection, etc.   Purposeful attention to your emotions and mental state can significantly improve your overall wellbeing  and  Health.  Please know that I am here to help you with all of your health care goals and am happy to work with you to find a solution that works best for you.  The greatest advice I have received with any changes in life are to take it one step at a time, that even means if all you can focus on is the next 60 seconds, then do that and celebrate your victories.  With any changes in life, you will have set backs, and that is OK. The important thing to remember is, if you have a set back, it is not a failure, it is an opportunity to try again!  Health Maintenance Recommendations Screening Testing Mammogram Every 1 -2 years based on history and risk factors Starting at age 47 Pap Smear Ages 21-39 every 3 years Ages 90-65 every 5 years with HPV testing More frequent testing may be required based on results and history Colon Cancer Screening Every 1-10 years based on test performed, risk factors, and history Starting at age 1 Bone Density  Screening Every 2-10 years based on history Starting at age 36 for women Recommendations for men differ based on medication usage, history, and risk factors AAA Screening One time ultrasound Men 13-54 years old who have every smoked Lung Cancer Screening Low Dose Lung CT every 12 months Age 33-80 years with a 30 pack-year smoking history who still smoke or who have quit within the last 15 years  Screening Labs Routine  Labs: Complete Blood Count (CBC), Complete Metabolic Panel (CMP), Cholesterol (Lipid Panel) Every 6-12 months based on history and medications May be recommended more frequently based on current conditions or previous results Hemoglobin A1c Lab Every 3-12 months based on history and previous results Starting at age 5 or earlier with diagnosis of diabetes, high cholesterol, BMI >26, and/or risk factors Frequent monitoring for patients with diabetes to ensure blood sugar control Thyroid Panel (TSH w/ T3 & T4) Every 6 months based on history, symptoms, and risk factors May be repeated more often if on medication HIV One time testing for all patients 7 and older May be repeated more frequently for patients with increased risk factors or exposure Hepatitis C One time testing for all patients 51 and older May be repeated more frequently for patients with increased risk factors or exposure Gonorrhea, Chlamydia Every 12 months for all sexually active persons 13-24 years Additional monitoring may be recommended for those who are considered high risk or who have symptoms PSA Men 52-17 years old with risk factors Additional screening may be recommended from age 41-69 based on risk factors, symptoms, and history  Vaccine Recommendations Tetanus Booster All adults every 10 years Flu  Vaccine All patients 6 months and older every year COVID Vaccine All patients 12 years and older Initial dosing with booster May recommend additional booster based on age and health  history HPV Vaccine 2 doses all patients age 76-26 Dosing may be considered for patients over 26 Shingles Vaccine (Shingrix) 2 doses all adults 16 years and older Pneumonia (Pneumovax 23) All adults 34 years and older May recommend earlier dosing based on health history Pneumonia (Prevnar 13) All adults 15 years and older Dosed 1 year after Pneumovax 23  Additional Screening, Testing, and Vaccinations may be recommended on an individualized basis based on family history, health history, risk factors, and/or exposure.   NEXT PREVENTATIVE PHYSICAL DUE IN 1 YEAR. Return in about 6 months (around 03/01/2021) for Anemia- repeat CBC.

## 2020-09-01 LAB — IGP,CTNG,RFXAPTHPV,RFX16/18,45
Chlamydia, Nuc. Acid Amp: NEGATIVE
Gonococcus by Nucleic Acid Amp: NEGATIVE
PAP Smear Comment: 0

## 2020-09-01 NOTE — Progress Notes (Signed)
Pap results normal.  There were fungal organisms present, which may indicate yeast infection.   Mychart msg sent- if no reply in 1 day, Please call patient and let her know testing was normal and ask if she is having any vaginal itching or increased discharge. If yes, OK to send diflucan 150mg  single dose to pharmacy.  Next testing due 08/2025

## 2020-09-07 ENCOUNTER — Telehealth (HOSPITAL_BASED_OUTPATIENT_CLINIC_OR_DEPARTMENT_OTHER): Payer: Self-pay

## 2020-09-07 NOTE — Telephone Encounter (Signed)
-----   Message from Tollie Eth, NP sent at 09/01/2020 12:19 PM EDT ----- Pap results normal.  There were fungal organisms present, which may indicate yeast infection.   Mychart msg sent- if no reply in 1 day, Please call patient and let her know testing was normal and ask if she is having any vaginal itching or increased discharge. If yes, OK to send diflucan 150mg  single dose to pharmacy.  Next testing due 08/2025

## 2020-09-07 NOTE — Telephone Encounter (Signed)
Left patient a detailed voicemail re: pap test results.  Instructed her to contact the office with questions or concerns or if she needs one time treatment of Diflucan sent to her pharmacy.

## 2020-09-30 ENCOUNTER — Other Ambulatory Visit (HOSPITAL_BASED_OUTPATIENT_CLINIC_OR_DEPARTMENT_OTHER): Payer: Self-pay | Admitting: Nurse Practitioner

## 2020-09-30 ENCOUNTER — Encounter (HOSPITAL_BASED_OUTPATIENT_CLINIC_OR_DEPARTMENT_OTHER): Payer: Self-pay | Admitting: Nurse Practitioner

## 2020-09-30 MED ORDER — FLUCONAZOLE 150 MG PO TABS: 150.0000 mg | ORAL_TABLET | Freq: Once | ORAL | 0 refills | Status: AC

## 2021-03-01 ENCOUNTER — Ambulatory Visit (HOSPITAL_BASED_OUTPATIENT_CLINIC_OR_DEPARTMENT_OTHER): Payer: 59 | Admitting: Nurse Practitioner

## 2021-08-30 ENCOUNTER — Ambulatory Visit (INDEPENDENT_AMBULATORY_CARE_PROVIDER_SITE_OTHER): Payer: 59 | Admitting: Family Medicine

## 2021-08-30 DIAGNOSIS — Z0000 Encounter for general adult medical examination without abnormal findings: Secondary | ICD-10-CM

## 2021-08-30 LAB — CBC WITH DIFF/PLATELET
Basophils Absolute: 0 10*3/uL (ref 0.0–0.2)
Basos: 1 %
EOS (ABSOLUTE): 0.2 10*3/uL (ref 0.0–0.4)
Eos: 4 %
Hematocrit: 31.6 % — ABNORMAL LOW (ref 34.0–46.6)
Hemoglobin: 10.3 g/dL — ABNORMAL LOW (ref 11.1–15.9)
Immature Grans (Abs): 0 10*3/uL (ref 0.0–0.1)
Immature Granulocytes: 0 %
Lymphocytes Absolute: 1.6 10*3/uL (ref 0.7–3.1)
Lymphs: 24 %
MCH: 26.2 pg — ABNORMAL LOW (ref 26.6–33.0)
MCHC: 32.6 g/dL (ref 31.5–35.7)
MCV: 80 fL (ref 79–97)
Monocytes Absolute: 0.4 10*3/uL (ref 0.1–0.9)
Monocytes: 7 %
Neutrophils Absolute: 4.1 10*3/uL (ref 1.4–7.0)
Neutrophils: 64 %
Platelets: 224 10*3/uL (ref 150–450)
RBC: 3.93 x10E6/uL (ref 3.77–5.28)
RDW: 13.2 % (ref 11.7–15.4)
WBC: 6.4 10*3/uL (ref 3.4–10.8)

## 2022-04-14 ENCOUNTER — Ambulatory Visit: Payer: 59 | Admitting: Family Medicine

## 2022-04-17 ENCOUNTER — Ambulatory Visit (INDEPENDENT_AMBULATORY_CARE_PROVIDER_SITE_OTHER): Payer: 59 | Admitting: Family Medicine

## 2022-04-17 ENCOUNTER — Encounter: Payer: Self-pay | Admitting: Family Medicine

## 2022-04-17 VITALS — BP 90/50 | HR 78 | Temp 98.0°F | Ht 63.0 in | Wt 143.1 lb

## 2022-04-17 DIAGNOSIS — M79641 Pain in right hand: Secondary | ICD-10-CM

## 2022-04-17 DIAGNOSIS — M79642 Pain in left hand: Secondary | ICD-10-CM | POA: Diagnosis not present

## 2022-04-17 DIAGNOSIS — D500 Iron deficiency anemia secondary to blood loss (chronic): Secondary | ICD-10-CM

## 2022-04-17 DIAGNOSIS — Z0000 Encounter for general adult medical examination without abnormal findings: Secondary | ICD-10-CM

## 2022-04-17 NOTE — Patient Instructions (Signed)
Welcome to [facility] at [facility]! It was a pleasure meeting you today. ° °As discussed, Please schedule a 12 month follow up visit today. ° °PLEASE NOTE: ° °If you had any LAB tests please let us know if you have not heard back within a few days. You may see your results on MyChart before we have a chance to review them but we will give you a call once they are reviewed by us. If we ordered any REFERRALS today, please let us know if you have not heard from their office within the next week.  °Let us know through MyChart if you are needing REFILLS, or have your pharmacy send us the request. You can also use MyChart to communicate with me or any office staff. ° °Please try these tips to maintain a healthy lifestyle: ° °Eat most of your calories during the day when you are active. Eliminate processed foods including packaged sweets (pies, cakes, cookies), reduce intake of potatoes, white bread, white pasta, and white rice. Look for whole grain options, oat flour or almond flour. ° °Each meal should contain half fruits/vegetables, one quarter protein, and one quarter carbs (no bigger than a computer mouse). ° °Cut down on sweet beverages. This includes juice, soda, and sweet tea. Also watch fruit intake, though this is a healthier sweet option, it still contains natural sugar! Limit to 3 servings daily. ° °Drink at least 1 glass of water with each meal and aim for at least 8 glasses per day ° °Exercise at least 150 minutes every week.   °

## 2022-04-17 NOTE — Progress Notes (Signed)
Phone [phone]   Subjective:   Patient is a 37 y.o. female presenting for annual physical.    Chief Complaint  Patient presents with   Establish Care    Need new pcp Need annual physical    New pt.  Annual.   Lump-R breast since 37yo.  Bx in 2019-non ca and f/u 39.  Occ sharp pain past few mo.  Sch appt w/gyn in Feb 19.  Stretches.  Zumba 3x/wk.   Anemia whole life  See problem oriented charting- ROS- ROS: Gen: no fever, chills  Skin: no rash, itching ENT: no ear pain, ear drainage, nasal congestion, rhinorrhea, sinus pressure, sore throat Eyes: no blurry vision, double vision Resp: no cough, wheeze,SOB CV: no CP, palpitations, LE edema,  GI: no heartburn, n/v/d/c, abd pain GU: no dysuria, urgency, frequency, hematuria MSK: no joint pain, myalgias, back pain Neuro: no dizziness, headache, weakness, vertigo.  When wakes up in am, stiffness in hands.  Non veg.   Psych: no depression, anxiety, insomnia, SI   The following were reviewed and entered/updated in epic: Past Medical History:  Diagnosis Date   Allergy    Eucalyptus   Gestational diabetes    Patient Active Problem List   Diagnosis Date Noted   Encounter for Papanicolaou smear for cervical cancer screening 08/30/2020   Menses, irregular 08/06/2020   Encounter for screening for cardiovascular disorders 08/06/2020   Screening for endocrine, nutritional, metabolic and immunity disorder 08/06/2020   Encounter for annual physical exam 08/06/2020   History of breast lump 02/12/2018   History of gestational diabetes 09/26/2012   History reviewed. No pertinent surgical history.  Family History  Problem Relation Age of Onset   Diabetes Paternal Grandfather    Diabetes Father     Medications- reviewed and updated No current outpatient medications on file.   No current facility-administered medications for this visit.    Allergies-reviewed and updated Allergies  Allergen Reactions   Eucalyptus Oil Rash     Social History   Social History Narrative   Not on file   Objective  Objective:  BP (!) 90/50   Pulse 78   Temp 98 F (36.7 C) (Temporal)   Ht 5' 3"$  (1.6 m)   Wt 143 lb 2 oz (64.9 kg)   LMP 03/25/2022 (Approximate)   SpO2 99%   BMI 25.35 kg/m  Physical Exam  Gen: WDWN NAD HEENT: NCAT, conjunctiva not injected, sclera nonicteric TM WNL B, OP moist, no exudates  NECK:  supple, no thyromegaly, no nodes, no carotid bruits CARDIAC: RRR, S1S2+, no murmur. DP 2+B LUNGS: CTAB. No wheezes ABDOMEN:  BS+, soft, NTND, No HSM, no masses EXT:  no edema MSK: no gross abnormalities. MS 5/5 all 4.  Hands-no swelling. No swollen joints. No TTP joints NEURO: A&O x3.  CN II-XII intact.  PSYCH: normal mood. Good eye contact   Pt willing to pay for d and RA   Assessment and Plan   Health Maintenance counseling: 1. Anticipatory guidance: Patient counseled regarding regular dental exams q6 months, eye exams,  avoiding smoking and second hand smoke, limiting alcohol to 1 beverage per day, no illicit drugs.   2. Risk factor reduction:  Advised patient of need for regular exercise and diet rich and fruits and vegetables to reduce risk of heart attack and stroke. Exercise- +.  Wt Readings from Last 3 Encounters:  04/17/22 143 lb 2 oz (64.9 kg)  08/30/20 146 lb 12.8 oz (66.6 kg)  08/06/20 146 lb 6.4  oz (66.4 kg)   3. Immunizations/screenings/ancillary studies Immunization History  Administered Date(s) Administered   PFIZER(Purple Top)SARS-COV-2 Vaccination 06/12/2019, 07/07/2019, 02/21/2020   Tdap 09/26/2012   There are no preventive care reminders to display for this patient.   4. Cervical cancer screening: gyn 5. Skin cancer screening- advised regular sunscreen use. Denies worrisome, changing, or new skin lesions.  6. Birth control/STD check: condoms 7. Smoking associated screening: + smoker 8. Alcohol screening: none  Problem List Items Addressed This Visit   None Visit  Diagnoses     Wellness examination    -  Primary   Relevant Orders   Comprehensive metabolic panel   Hemoglobin A1c   Lipid panel   TSH   Rheumatoid factor   VITAMIN D 25 Hydroxy (Vit-D Deficiency, Fractures)   Pain in both hands       Relevant Orders   Rheumatoid factor   VITAMIN D 25 Hydroxy (Vit-D Deficiency, Fractures)   Iron deficiency anemia due to chronic blood loss          Wellness-anticipatory guidance.  Work on [employer].  Check CBC,CMP,lipids,TSH, A1C.  F/u 1 yr  B hand pain stiffness-could be oa,ra,other.  Pt wants to check D and RA-advised that may not be covered under her wellness.  Also, may or may not be covered or applied to deductible under problem visit.  We discussed that she may be responsible for the charges.  I looked up the costs and advised that it may be around $111.  She was willing to pay that if it is not covered any other way.  So this was ordered vitamin D and RA. Chronic anemia-hemoglobin runs in the tens.  Per her it is always like that.  She asked about iron studies.  Again advised that that is not a routine part of wellness.  At this time, she would like to defer iron studies and B12.  Advised to take a multivitamin with iron.  Recommended follow up: annual Return in about 1 year (around 04/18/2023) for annual. Future Appointments  Date Time Provider [place]  04/19/2023  9:00 AM Tawnya Crook, MD [facility]    Lab/Order associations:ate 1 hr ago fasting   ICD-10-CM   1. Wellness examination  Z00.00 Comprehensive metabolic panel    Hemoglobin A1c    Lipid panel    TSH    Rheumatoid factor    VITAMIN D 25 Hydroxy (Vit-D Deficiency, Fractures)    CANCELED: VITAMIN D 25 Hydroxy (Vit-D Deficiency, Fractures)    CANCELED: Rheumatoid factor    CANCELED: CBC with Differential/Platelet    2. Pain in both hands  M79.641 Rheumatoid factor   M79.642 VITAMIN D 25 Hydroxy (Vit-D Deficiency, Fractures)    3. Iron deficiency anemia due to chronic  blood loss  D50.0       No orders of the defined types were placed in this encounter.    Wellington Hampshire, MD

## 2022-04-18 LAB — LIPID PANEL
Cholesterol: 134 mg/dL (ref 0–200)
HDL: 54.3 mg/dL (ref >39.00)
LDL Cholesterol: 63 mg/dL (ref 0–99)
NonHDL: 80.14
Total CHOL/HDL Ratio: 2
Triglycerides: 85 mg/dL (ref 0.0–149.0)
VLDL: 17 mg/dL (ref 0.0–40.0)

## 2022-04-18 LAB — TSH: TSH: 1.88 u[IU]/mL (ref 0.35–5.50)

## 2022-04-18 LAB — VITAMIN D 25 HYDROXY (VIT D DEFICIENCY, FRACTURES): VITD: 18.94 ng/mL — ABNORMAL LOW (ref 30.00–100.00)

## 2022-04-18 LAB — COMPREHENSIVE METABOLIC PANEL
ALT: 11 U/L (ref 0–35)
AST: 15 U/L (ref 0–37)
Albumin: 4 g/dL (ref 3.5–5.2)
Alkaline Phosphatase: 51 U/L (ref 39–117)
BUN: 9 mg/dL (ref 6–23)
CO2: 27 mEq/L (ref 19–32)
Calcium: 9.1 mg/dL (ref 8.4–10.5)
Chloride: 106 mEq/L (ref 96–112)
Creatinine, Ser: 0.59 mg/dL (ref 0.40–1.20)
GFR: 115.15 mL/min (ref >60.00)
Glucose, Bld: 125 mg/dL — ABNORMAL HIGH (ref 70–99)
Potassium: 4.3 mEq/L (ref 3.5–5.1)
Sodium: 141 mEq/L (ref 135–145)
Total Bilirubin: 0.5 mg/dL (ref 0.2–1.2)
Total Protein: 6.6 g/dL (ref 6.0–8.3)

## 2022-04-18 LAB — RHEUMATOID FACTOR: Rhuematoid fact SerPl-aCnc: <14 IU/mL (ref <14)

## 2022-04-18 LAB — HEMOGLOBIN A1C: Hgb A1c MFr Bld: 5.1 % (ref 4.6–6.5)

## 2022-04-18 NOTE — Progress Notes (Signed)
Not fasting so sugar was a little elevated.   Labs great/normal except Vitamin D a little low-take 2000 iu/day OTC

## 2022-04-26 ENCOUNTER — Other Ambulatory Visit: Payer: Self-pay | Admitting: Obstetrics & Gynecology

## 2022-04-26 DIAGNOSIS — N6310 Unspecified lump in the right breast, unspecified quadrant: Secondary | ICD-10-CM

## 2023-04-19 ENCOUNTER — Ambulatory Visit: Payer: 59 | Admitting: Family Medicine

## 2023-04-19 ENCOUNTER — Encounter: Payer: Self-pay | Admitting: Family Medicine

## 2023-04-19 VITALS — BP 118/70 | HR 67 | Temp 97.8°F | Resp 16 | Ht 63.0 in | Wt 145.0 lb

## 2023-04-19 DIAGNOSIS — Z13220 Encounter for screening for lipoid disorders: Secondary | ICD-10-CM

## 2023-04-19 DIAGNOSIS — Z0000 Encounter for general adult medical examination without abnormal findings: Secondary | ICD-10-CM

## 2023-04-19 DIAGNOSIS — Z131 Encounter for screening for diabetes mellitus: Secondary | ICD-10-CM

## 2023-04-19 LAB — COMPREHENSIVE METABOLIC PANEL
ALT: 17 U/L (ref 0–35)
AST: 24 U/L (ref 0–37)
Albumin: 4 g/dL (ref 3.5–5.2)
Alkaline Phosphatase: 57 U/L (ref 39–117)
BUN: 12 mg/dL (ref 6–23)
CO2: 25 meq/L (ref 19–32)
Calcium: 9 mg/dL (ref 8.4–10.5)
Chloride: 107 meq/L (ref 96–112)
Creatinine, Ser: 0.66 mg/dL (ref 0.40–1.20)
GFR: 111.29 mL/min (ref >60.00)
Glucose, Bld: 93 mg/dL (ref 70–99)
Potassium: 5.5 meq/L — ABNORMAL HIGH (ref 3.5–5.1)
Sodium: 142 meq/L (ref 135–145)
Total Bilirubin: 0.6 mg/dL (ref 0.2–1.2)
Total Protein: 7.3 g/dL (ref 6.0–8.3)

## 2023-04-19 LAB — CBC WITH DIFFERENTIAL/PLATELET
Basophils Absolute: 0 10*3/uL (ref 0.0–0.1)
Basophils Relative: 0.3 % (ref 0.0–3.0)
Eosinophils Absolute: 0 10*3/uL (ref 0.0–0.7)
Eosinophils Relative: 1 % (ref 0.0–5.0)
HCT: 32.1 % — ABNORMAL LOW (ref 36.0–46.0)
Hemoglobin: 10.3 g/dL — ABNORMAL LOW (ref 12.0–15.0)
Lymphocytes Relative: 31.5 % (ref 12.0–46.0)
Lymphs Abs: 1.4 10*3/uL (ref 0.7–4.0)
MCHC: 32.2 g/dL (ref 30.0–36.0)
MCV: 75 fL — ABNORMAL LOW (ref 78.0–100.0)
Monocytes Absolute: 0.4 10*3/uL (ref 0.1–1.0)
Monocytes Relative: 8.3 % (ref 3.0–12.0)
Neutro Abs: 2.6 10*3/uL (ref 1.4–7.7)
Neutrophils Relative %: 58.9 % (ref 43.0–77.0)
Platelets: 239 10*3/uL (ref 150.0–400.0)
RBC: 4.28 Mil/uL (ref 3.87–5.11)
RDW: 14.8 % (ref 11.5–15.5)
WBC: 4.4 10*3/uL (ref 4.0–10.5)

## 2023-04-19 LAB — LIPID PANEL
Cholesterol: 140 mg/dL (ref 0–200)
HDL: 62.2 mg/dL (ref >39.00)
LDL Cholesterol: 67 mg/dL (ref 0–99)
NonHDL: 78.15
Total CHOL/HDL Ratio: 2
Triglycerides: 58 mg/dL (ref 0.0–149.0)
VLDL: 11.6 mg/dL (ref 0.0–40.0)

## 2023-04-19 LAB — HEMOGLOBIN A1C: Hgb A1c MFr Bld: 5.3 % (ref 4.6–6.5)

## 2023-04-19 LAB — TSH: TSH: 2.24 u[IU]/mL (ref 0.35–5.50)

## 2023-04-19 NOTE — Progress Notes (Signed)
Phone [phone]   Subjective:   Patient is a 38 y.o. female presenting for annual physical.    Chief Complaint  Patient presents with   Annual Exam    CPE Fasting    Annual-not exercising as much Discussed the use of AI scribe software for clinical note transcription with the patient, who gave verbal consent to proceed.  History of Present Illness   Denise Drake is a 38 year old female who presents for an annual physical exam.  No new surgeries, chest pain, palpitations, shortness of breath, dizziness, syncope, headaches, vomiting, diarrhea, constipation, or gastrointestinal issues. She has a cough but no other respiratory symptoms. Approximately ten days ago, she had the flu with fatigue, fever, and chills, but she is now feeling better.  She experiences morning joint pain, particularly in her hands, which makes it difficult to close her fists tightly. She has previously taken vitamin D but stopped, and she is currently taking multivitamins and B12. She has also started making turmeric root tea recently.  She has a history of gestational diabetes and is aware of the need to maintain an active lifestyle and healthy diet to prevent diabetes.  She is currently on a break from school and reports feeling better after a period of stress related to her father's recent health issues. She does not drink alcohol and is on birth control.       See problem oriented charting- ROS- ROS: Gen: no fever, chills  Skin: no rash, itching ENT: no ear pain, ear drainage, nasal congestion, rhinorrhea, sinus pressure, sore throat Eyes: no blurry vision, double vision Resp: no cough, wheeze,SOB CV: no CP, palpitations, LE edema,  GI: no heartburn, n/v/d/c, abd pain GU: no dysuria, urgency, frequency, hematuria MSK: hands stiff in am Neuro: no dizziness, headache, weakness, vertigo Psych: no depression, anxiety, insomnia, SI   The following were reviewed and entered/updated in  epic: Past Medical History:  Diagnosis Date   Allergy    Eucalyptus   Gestational diabetes    Patient Active Problem List   Diagnosis Date Noted   Encounter for Papanicolaou smear for cervical cancer screening 08/30/2020   Menses, irregular 08/06/2020   Encounter for screening for cardiovascular disorders 08/06/2020   Screening for endocrine, nutritional, metabolic and immunity disorder 08/06/2020   Encounter for annual physical exam 08/06/2020   History of breast lump 02/12/2018   History of gestational diabetes 09/26/2012   History reviewed. No pertinent surgical history.  Family History  Problem Relation Age of Onset   Heart disease Father 22       CABG   Diabetes Father    Diabetes Paternal Grandfather     Medications- reviewed and updated No current outpatient medications on file.   No current facility-administered medications for this visit.    Allergies-reviewed and updated Allergies  Allergen Reactions   Eucalyptus Oil Rash    Social History   Social History Narrative   Not on file   Objective  Objective:  BP 118/70   Pulse 67   Temp 97.8 F (36.6 C) (Temporal)   Resp 16   Ht 5\' 3"  (1.6 m)   Wt 145 lb (65.8 kg)   LMP 04/01/2023 (Approximate)   SpO2 99%   BMI 25.69 kg/m  Physical Exam  Gen: WDWN NAD HEENT: NCAT, conjunctiva not injected, sclera nonicteric TM WNL B, OP moist, no exudates  NECK:  supple, no thyromegaly, no nodes, no carotid bruits CARDIAC: RRR, S1S2+, no murmur. DP 2+B LUNGS: CTAB. No  wheezes ABDOMEN:  BS+, soft, NTND, No HSM, no masses EXT:  no edema MSK: no gross abnormalities. MS 5/5 all 4 NEURO: A&O x3.  CN II-XII intact.  PSYCH: normal mood. Good eye contact      Assessment and Plan   Health Maintenance counseling: 1. Anticipatory guidance: Patient counseled regarding regular dental exams q6 months, eye exams,  avoiding smoking and second hand smoke, limiting alcohol to 1 beverage per day, no illicit drugs.   2.  Risk factor reduction:  Advised patient of need for regular exercise and diet rich and fruits and vegetables to reduce risk of heart attack and stroke. Exercise- encouraged.  Wt Readings from Last 3 Encounters:  04/19/23 145 lb (65.8 kg)  04/17/22 143 lb 2 oz (64.9 kg)  08/30/20 146 lb 12.8 oz (66.6 kg)   3. Immunizations/screenings/ancillary studies Immunization History  Administered Date(s) Administered   Influenza-Unspecified 01/10/2008   MMR 10/18/2007   PFIZER(Purple Top)SARS-COV-2 Vaccination 06/12/2019, 07/07/2019, 02/21/2020   Td (Adult),5 Lf Tetanus Toxid, Preservative Free 11/29/2007   Tdap 10/18/2007, 04/15/2008, 09/26/2012   Health Maintenance Due  Topic Date Due   Hepatitis C Screening  Never done   DTaP/Tdap/Td (5 - Td or Tdap) 09/27/2022    4. Cervical cancer screening: utd 5. Skin cancer screening- advised regular sunscreen use. Denies worrisome, changing, or new skin lesions.  6. Birth control/STD check: condoms 7. Smoking associated screening: non smoker 8. Alcohol screening: non  Wellness examination -     CBC with Differential/Platelet -     Comprehensive metabolic panel -     TSH -     Lipid panel -     Hemoglobin A1c   Wellness-anticipatory guidance.  Work on Diet/Exercise  Check CBC,CMP,lipids,TSH, A1C.  F/u 1 yr  Assessment and Plan    Arthralgia Reports of morning joint stiffness and difficulty closing fists may be linked to low vitamin D levels. Previous tests for rheumatoid arthritis were negative. There is a history of anemia, likely beta thalassemia minor. Currently taking B12 supplements but had stopped vitamin D. Prefers natural remedies and has started making turmeric root tea. Resume vitamin D supplementation. Consider turmeric and ginger supplements for her anti-inflammatory properties. Monitor symptoms and reassess if necessary.  Gestational Diabetes Mellitus Advised to maintain an active lifestyle and healthy diet to prevent type 2 diabetes,  given family history. Discussed the importance of regular physical activity and dietary management. Encourage regular physical activity and maintaining a healthy diet. Monitor blood glucose levels periodically.  General Health Maintenance During a routine follow-up visit, reports a recent flu episode with resolved symptoms. Family history is significant for father's recent bypass surgery and gallbladder infection. Currently on a semester break from school and reports feeling better after stress related to father's health. Administer tetanus vaccine. Perform blood tests including thyroid, A1c, cholesterol, anemia, kidney, and liver function. Encourage regular exercise. Advise wearing a seatbelt and sunscreen. Schedule a follow-up visit in one year.        Recommended follow up: Return in about 1 year (around 04/18/2024) for annual physical.  Lab/Order associations:+ fasting   Angelena Sole, MD

## 2023-04-19 NOTE — Patient Instructions (Signed)

## 2023-04-19 NOTE — Progress Notes (Signed)
Labs are great except potassium high-may be from lab draw or other.  Needs to repeat potassium next week

## 2023-04-20 ENCOUNTER — Other Ambulatory Visit: Payer: Self-pay | Admitting: *Deleted

## 2023-04-20 DIAGNOSIS — E875 Hyperkalemia: Secondary | ICD-10-CM

## 2023-04-20 NOTE — Progress Notes (Signed)
 Letter que review. Lab only.

## 2023-04-23 ENCOUNTER — Other Ambulatory Visit (HOSPITAL_COMMUNITY): Payer: Self-pay | Admitting: Obstetrics & Gynecology

## 2023-04-23 DIAGNOSIS — N6310 Unspecified lump in the right breast, unspecified quadrant: Secondary | ICD-10-CM

## 2024-04-21 ENCOUNTER — Encounter: Payer: 59 | Admitting: Family Medicine
